# Patient Record
Sex: Female | Born: 1960
Health system: Southern US, Community
[De-identification: ages and names within clinical notes are randomized; demographics above are authoritative.]

## PROBLEM LIST (undated history)

## (undated) DIAGNOSIS — F32A Depression, unspecified: Secondary | ICD-10-CM

## (undated) DIAGNOSIS — F319 Bipolar disorder, unspecified: Secondary | ICD-10-CM

## (undated) DIAGNOSIS — F419 Anxiety disorder, unspecified: Secondary | ICD-10-CM

## (undated) DIAGNOSIS — F329 Major depressive disorder, single episode, unspecified: Secondary | ICD-10-CM

## (undated) DIAGNOSIS — M2550 Pain in unspecified joint: Secondary | ICD-10-CM

## (undated) DIAGNOSIS — I10 Essential (primary) hypertension: Secondary | ICD-10-CM

## (undated) DIAGNOSIS — F1010 Alcohol abuse, uncomplicated: Secondary | ICD-10-CM

---

## 2005-01-08 ENCOUNTER — Ambulatory Visit: Payer: Self-pay

## 2011-02-12 ENCOUNTER — Other Ambulatory Visit (HOSPITAL_COMMUNITY): Payer: Self-pay | Admitting: Family Medicine

## 2011-02-12 DIAGNOSIS — Z1231 Encounter for screening mammogram for malignant neoplasm of breast: Secondary | ICD-10-CM

## 2011-02-20 ENCOUNTER — Inpatient Hospital Stay (HOSPITAL_COMMUNITY)
Admission: AD | Admit: 2011-02-20 | Discharge: 2011-02-20 | Disposition: A | Discharge status: Home / Self Care | Payer: Self-pay | Source: Ambulatory Visit | Attending: Obstetrics and Gynecology | Admitting: Obstetrics and Gynecology

## 2011-02-20 ENCOUNTER — Ambulatory Visit (HOSPITAL_COMMUNITY)
Admission: RE | Admit: 2011-02-20 | Discharge: 2011-02-20 | Disposition: A | Discharge status: Home / Self Care | Payer: Self-pay | Source: Ambulatory Visit | Attending: Family Medicine | Admitting: Family Medicine

## 2011-02-20 DIAGNOSIS — Z1231 Encounter for screening mammogram for malignant neoplasm of breast: Secondary | ICD-10-CM | POA: Insufficient documentation

## 2011-02-20 DIAGNOSIS — R300 Dysuria: Secondary | ICD-10-CM | POA: Insufficient documentation

## 2011-02-20 DIAGNOSIS — N390 Urinary tract infection, site not specified: Secondary | ICD-10-CM

## 2011-02-20 LAB — URINE MICROSCOPIC-ADD ON

## 2011-02-20 LAB — URINALYSIS, ROUTINE W REFLEX MICROSCOPIC
Bilirubin Urine: NEGATIVE
Glucose, UA: NEGATIVE mg/dL
Ketones, ur: NEGATIVE mg/dL
pH: 6 (ref 5.0–8.0)

## 2011-02-25 ENCOUNTER — Inpatient Hospital Stay (HOSPITAL_COMMUNITY)
Admission: AD | Admit: 2011-02-25 | Discharge: 2011-02-25 | Disposition: A | Discharge status: Home / Self Care | Payer: Self-pay | Source: Ambulatory Visit | Attending: Family Medicine | Admitting: Family Medicine

## 2011-02-25 DIAGNOSIS — T3795XA Adverse effect of unspecified systemic anti-infective and antiparasitic, initial encounter: Secondary | ICD-10-CM | POA: Insufficient documentation

## 2011-02-25 DIAGNOSIS — L299 Pruritus, unspecified: Secondary | ICD-10-CM | POA: Insufficient documentation

## 2014-12-18 ENCOUNTER — Encounter (HOSPITAL_COMMUNITY): Payer: Self-pay | Admitting: Emergency Medicine

## 2014-12-18 ENCOUNTER — Emergency Department (HOSPITAL_COMMUNITY): Payer: Self-pay

## 2014-12-18 ENCOUNTER — Emergency Department (HOSPITAL_COMMUNITY)
Admission: EM | Admit: 2014-12-18 | Discharge: 2014-12-18 | Disposition: A | Discharge status: Home / Self Care | Payer: Self-pay | Attending: Emergency Medicine | Admitting: Emergency Medicine

## 2014-12-18 DIAGNOSIS — I10 Essential (primary) hypertension: Secondary | ICD-10-CM | POA: Insufficient documentation

## 2014-12-18 DIAGNOSIS — W010XXA Fall on same level from slipping, tripping and stumbling without subsequent striking against object, initial encounter: Secondary | ICD-10-CM | POA: Insufficient documentation

## 2014-12-18 DIAGNOSIS — M25519 Pain in unspecified shoulder: Secondary | ICD-10-CM

## 2014-12-18 DIAGNOSIS — F10129 Alcohol abuse with intoxication, unspecified: Secondary | ICD-10-CM | POA: Insufficient documentation

## 2014-12-18 DIAGNOSIS — Y9289 Other specified places as the place of occurrence of the external cause: Secondary | ICD-10-CM | POA: Insufficient documentation

## 2014-12-18 DIAGNOSIS — Y9389 Activity, other specified: Secondary | ICD-10-CM | POA: Insufficient documentation

## 2014-12-18 DIAGNOSIS — S43004A Unspecified dislocation of right shoulder joint, initial encounter: Secondary | ICD-10-CM

## 2014-12-18 DIAGNOSIS — S43141A Inferior dislocation of right acromioclavicular joint, initial encounter: Secondary | ICD-10-CM | POA: Insufficient documentation

## 2014-12-18 DIAGNOSIS — Y998 Other external cause status: Secondary | ICD-10-CM | POA: Insufficient documentation

## 2014-12-18 DIAGNOSIS — Z880 Allergy status to penicillin: Secondary | ICD-10-CM | POA: Insufficient documentation

## 2014-12-18 HISTORY — DX: Essential (primary) hypertension: I10

## 2014-12-18 MED ORDER — LIDOCAINE HCL 1 % IJ SOLN
30.0000 mL | Freq: Once | INTRAMUSCULAR | Status: AC
  Administered 2014-12-18: 30 mL

## 2014-12-18 MED ORDER — HYDROCODONE-ACETAMINOPHEN 5-325 MG PO TABS
2.0000 | ORAL_TABLET | Freq: Once | ORAL | Status: AC
  Administered 2014-12-18: 2 via ORAL
  Filled 2014-12-18: qty 2
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
## 2014-12-18 NOTE — Discharge Instructions (Signed)
Shoulder Dislocation Ms. Cleckler, follow-up with orthopedic surgery within 3 days for continued management. Wear your sling until then. If any symptoms worsen come back to the emergency department immediately. Thank you.  Shoulder dislocation is when your upper arm bone (humerus) is forced out of your shoulder joint. Your doctor will put your shoulder back into the joint by pulling on your arm or through surgery. Your arm will be placed in a shoulder immobilizer or sling. The shoulder immobilizer or sling holds your shoulder in place while it heals. HOME CARE   Rest your injured joint. Do not move it until instructed to do so.  Put ice on your injured joint as told by your doctor.  Put ice in a plastic bag.  Place a towel between your skin and the bag.  Leave the ice on for 15-20 minutes at a time, every 2 hours while you are awake.  Only take medicines as told by your doctor.  Squeeze a ball to exercise your hand. GET HELP RIGHT AWAY IF:   Your splint or sling becomes damaged.  Your pain becomes worse, not better.  You lose feeling in your arm or hand.  Your arm or hand becomes white or cold. MAKE SURE YOU:   Understand these instructions.  Will watch your condition.  Will get help right away if you are not doing well or get worse. Document Released: 12/23/2011 Document Reviewed: 12/23/2011 [facility] Patient Information 2015 [place], [place]. This information is not intended to replace advice given to you by your health care provider. Make sure you discuss any questions you have with your health care provider.
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
## 2015-04-01 ENCOUNTER — Emergency Department (HOSPITAL_COMMUNITY): Payer: Self-pay

## 2015-04-01 ENCOUNTER — Emergency Department (HOSPITAL_COMMUNITY)
Admission: EM | Admit: 2015-04-01 | Discharge: 2015-04-02 | Disposition: A | Discharge status: Home / Self Care | Payer: Self-pay | Attending: Emergency Medicine | Admitting: Emergency Medicine

## 2015-04-01 ENCOUNTER — Encounter (HOSPITAL_COMMUNITY): Payer: Self-pay | Admitting: Nurse Practitioner

## 2015-04-01 DIAGNOSIS — Z720 Tobacco use: Secondary | ICD-10-CM | POA: Insufficient documentation

## 2015-04-01 DIAGNOSIS — Y998 Other external cause status: Secondary | ICD-10-CM | POA: Insufficient documentation

## 2015-04-01 DIAGNOSIS — S0083XA Contusion of other part of head, initial encounter: Secondary | ICD-10-CM | POA: Insufficient documentation

## 2015-04-01 DIAGNOSIS — S62624A Displaced fracture of medial phalanx of right ring finger, initial encounter for closed fracture: Secondary | ICD-10-CM | POA: Insufficient documentation

## 2015-04-01 DIAGNOSIS — Y9289 Other specified places as the place of occurrence of the external cause: Secondary | ICD-10-CM | POA: Insufficient documentation

## 2015-04-01 DIAGNOSIS — S62604A Fracture of unspecified phalanx of right ring finger, initial encounter for closed fracture: Secondary | ICD-10-CM

## 2015-04-01 DIAGNOSIS — Z880 Allergy status to penicillin: Secondary | ICD-10-CM | POA: Insufficient documentation

## 2015-04-01 DIAGNOSIS — I10 Essential (primary) hypertension: Secondary | ICD-10-CM | POA: Insufficient documentation

## 2015-04-01 DIAGNOSIS — Y9389 Activity, other specified: Secondary | ICD-10-CM | POA: Insufficient documentation

## 2015-04-01 MED ORDER — TETRACAINE HCL 0.5 % OP SOLN
2.0000 [drp] | Freq: Once | OPHTHALMIC | Status: DC
  Filled 2015-04-01: qty 2

## 2015-04-01 MED ORDER — FLUORESCEIN SODIUM 1 MG OP STRP
1.0000 | ORAL_STRIP | Freq: Once | OPHTHALMIC | Status: AC
  Administered 2015-04-02: 1 via OPHTHALMIC
  Filled 2015-04-01: qty 1

## 2015-04-01 MED ORDER — IBUPROFEN 800 MG PO TABS: 800.0000 mg | ORAL_TABLET | Freq: Once | ORAL | Status: DC
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
## 2015-04-02 MED ORDER — OXYCODONE-ACETAMINOPHEN 5-325 MG PO TABS
1.0000 | ORAL_TABLET | Freq: Once | ORAL | Status: AC
  Administered 2015-04-02: 1 via ORAL
  Filled 2015-04-02: qty 1

## 2015-04-02 MED ORDER — NAPROXEN 500 MG PO TABS: 500.0000 mg | ORAL_TABLET | Freq: Two times a day (BID) | ORAL | Status: DC

## 2015-04-02 MED ORDER — FLUORESCEIN SODIUM 1 MG OP STRP
1.0000 | ORAL_STRIP | Freq: Once | OPHTHALMIC | Status: DC
  Filled 2015-04-02: qty 1
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
## 2015-04-02 NOTE — Progress Notes (Signed)
Pt calling family members to find ride home as she has been discharged.

## 2015-04-02 NOTE — Discharge Instructions (Signed)
Finger Fracture Fractures of fingers are breaks in the bones of the fingers. There are many types of fractures. There are different ways of treating these fractures. Your health care provider will discuss the best way to treat your fracture. CAUSES Traumatic injury is the main cause of broken fingers. These include:  Injuries while playing sports.  Workplace injuries.  Falls. RISK FACTORS Activities that can increase your risk of finger fractures include:  Sports.  Workplace activities that involve machinery.  A condition called osteoporosis, which can make your bones less dense and cause them to fracture more easily. SIGNS AND SYMPTOMS The main symptoms of a broken finger are pain and swelling within 15 minutes after the injury. Other symptoms include:  Bruising of your finger.  Stiffness of your finger.  Numbness of your finger.  Exposed bones (compound fracture) if the fracture is severe. DIAGNOSIS  The best way to diagnose a broken bone is with X-ray imaging. Additionally, your health care provider will use this X-ray image to evaluate the position of the broken finger bones.  TREATMENT  Finger fractures can be treated with:   Nonreduction--This means the bones are in place. The finger is splinted without changing the positions of the bone pieces. The splint is usually left on for about a week to 10 days. This will depend on your fracture and what your health care provider thinks.  Closed reduction--The bones are put back into position without using surgery. The finger is then splinted.  Open reduction and internal fixation--The fracture site is opened. Then the bone pieces are fixed into place with pins or some type of hardware. This is seldom required. It depends on the severity of the fracture. HOME CARE INSTRUCTIONS   Follow your health care provider's instructions regarding activities, exercises, and physical therapy.  Only take over-the-counter or prescription  medicines for pain, discomfort, or fever as directed by your health care provider. SEEK MEDICAL CARE IF: You have pain or swelling that limits the motion or use of your fingers. SEEK IMMEDIATE MEDICAL CARE IF:  Your finger becomes numb. MAKE SURE YOU:   Understand these instructions.  Will watch your condition.  Will get help right away if you are not doing well or get worse. Document Released: 01/12/2001 Document Revised: 07/21/2013 Document Reviewed: 05/12/2013 [facility] Patient Information 2015 [place], [place]. This information is not intended to replace advice given to you by your health care provider. Make sure you discuss any questions you have with your health care provider.  Eye Contusion An eye contusion is a deep bruise of the eye. This is often called a "black eye." Contusions are the result of an injury that caused bleeding under the skin. The contusion may turn blue, purple, or yellow. Minor injuries will give you a painless contusion, but more severe contusions may stay painful and swollen for a few weeks. If the eye contusion only involves the eyelids and tissues around the eye, the injured area will get better within a few days to weeks. However, eye contusions can be serious and affect the eyeball and sight. CAUSES   Blunt injury or trauma to the face or eye area.  A forehead injury that causes the blood under the skin to work its way down to the eyelids.  Rubbing the eyes due to irritation. SYMPTOMS   Swelling and redness around the eye.  Bruising around the eye.  Tenderness, soreness, or pain around the eye.  Blurry vision.  Tearing.  Eyeball redness. DIAGNOSIS  A  diagnosis is usually based on a thorough exam of the eye and surrounding area. The eye must be looked at carefully to make sure it is not injured and to make sure nothing else will threaten your vision. A vision test may be done. An X-ray or computed tomography (CT) scan may be needed to determine if  there are any associated injuries, such as broken bones (fractures). TREATMENT  If there is an injury to the eye, treatment will be determined by the nature of the injury. HOME CARE INSTRUCTIONS   Put ice on the injured area.  Put ice in a plastic bag.  Place a towel between your skin and the bag.  Leave the ice on for 15-20 minutes, 03-04 times a day.  If it is determined that there is no injury to the eye, you may continue normal activities.  Sunglasses may be worn to protect your eyes from bright light if light is uncomfortable.  Sleep with your head elevated. You can put an extra pillow under your head. This may help with discomfort.  Only take over-the-counter or prescription medicines for pain, discomfort, or fever as directed by your caregiver. Do not take aspirin for the first few days. This may increase bruising. SEEK IMMEDIATE MEDICAL CARE IF:   You have any form of vision loss.  You have double vision.  You feel nauseous.  You feel dizzy, sleepy, or like you will faint.  You have any fluid discharge from the eye or your nose.  You have swelling and discoloration that does not fade. MAKE SURE YOU:   Understand these instructions.  Will watch your condition.  Will get help right away if you are not doing well or get worse. Document Released: 09/27/2000 Document Revised: 12/23/2011 Document Reviewed: 08/16/2011 [facility] Patient Information 2015 [place], [place]. This information is not intended to replace advice given to you by your health care provider. Make sure you discuss any questions you have with your health care provider.  Assault, General Assault includes any behavior, whether intentional or reckless, which results in bodily injury to another person and/or damage to property. Included in this would be any behavior, intentional or reckless, that by its nature would be understood (interpreted) by a reasonable person as intent to harm another person or to  damage his/her property. Threats may be oral or written. They may be communicated through regular mail, computer, fax, or phone. These threats may be direct or implied. FORMS OF ASSAULT INCLUDE:  Physically assaulting a person. This includes physical threats to inflict physical harm as well as:  Slapping.  Hitting.  Poking.  Kicking.  Punching.  Pushing.  Arson.  Sabotage.  Equipment vandalism.  Damaging or destroying property.  Throwing or hitting objects.  Displaying a weapon or an object that appears to be a weapon in a threatening manner.  Carrying a firearm of any kind.  Using a weapon to harm someone.  Using greater physical size/strength to intimidate another.  Making intimidating or threatening gestures.  Bullying.  Hazing.  Intimidating, threatening, hostile, or abusive language directed toward another person.  It communicates the intention to engage in violence against that person. And it leads a reasonable person to expect that violent behavior may occur.  Stalking another person. IF IT HAPPENS AGAIN:  Immediately call for emergency help (911 in [place]  If someone poses clear and immediate danger to you, seek legal authorities to have a protective or restraining order put in place.  Less threatening assaults can at  least be reported to authorities. STEPS TO TAKE IF A SEXUAL ASSAULT HAS HAPPENED  Go to an area of safety. This may include a shelter or staying with a friend. Stay away from the area where you have been attacked. A large percentage of sexual assaults are caused by a friend, relative or associate.  If medications were given by your caregiver, take them as directed for the full length of time prescribed.  Only take over-the-counter or prescription medicines for pain, discomfort, or fever as directed by your caregiver.  If you have come in contact with a sexual disease, find out if you are to be tested again. If your caregiver is  concerned about the HIV/AIDS virus, he/she may require you to have continued testing for several months.  For the protection of your privacy, test results can not be given over the phone. Make sure you receive the results of your test. If your test results are not back during your visit, make an appointment with your caregiver to find out the results. Do not assume everything is normal if you have not heard from your caregiver or the medical facility. It is important for you to follow up on all of your test results.  File appropriate papers with authorities. This is important in all assaults, even if it has occurred in a family or by a friend. SEEK MEDICAL CARE IF:  You have new problems because of your injuries.  You have problems that may be because of the medicine you are taking, such as:  Rash.  Itching.  Swelling.  Trouble breathing.  You develop belly (abdominal) pain, feel sick to your stomach (nausea) or are vomiting.  You begin to run a temperature.  You need supportive care or referral to a rape crisis center. These are centers with trained personnel who can help you get through this ordeal. SEEK IMMEDIATE MEDICAL CARE IF:  You are afraid of being threatened, beaten, or abused. In [place] call 911.  You receive new injuries related to abuse.  You develop severe pain in any area injured in the assault or have any change in your condition that concerns you.  You faint or lose consciousness.  You develop chest pain or shortness of breath. Document Released: 09/30/2005 Document Revised: 12/23/2011 Document Reviewed: 05/18/2008 [facility] Patient Information 2015 [place], [place]. This information is not intended to replace advice given to you by your health care provider. Make sure you discuss any questions you have with your health care provider.

## 2015-07-19 ENCOUNTER — Other Ambulatory Visit: Payer: Self-pay

## 2015-07-20 ENCOUNTER — Emergency Department (HOSPITAL_COMMUNITY)
Admission: EM | Admit: 2015-07-20 | Discharge: 2015-07-20 | Disposition: A | Discharge status: Home / Self Care | Payer: Self-pay | Attending: Emergency Medicine | Admitting: Emergency Medicine

## 2015-07-20 ENCOUNTER — Encounter (HOSPITAL_COMMUNITY): Payer: Self-pay | Admitting: Emergency Medicine

## 2015-07-20 DIAGNOSIS — M542 Cervicalgia: Secondary | ICD-10-CM | POA: Insufficient documentation

## 2015-07-20 DIAGNOSIS — Z880 Allergy status to penicillin: Secondary | ICD-10-CM | POA: Insufficient documentation

## 2015-07-20 DIAGNOSIS — I10 Essential (primary) hypertension: Secondary | ICD-10-CM | POA: Insufficient documentation

## 2015-07-20 DIAGNOSIS — Z720 Tobacco use: Secondary | ICD-10-CM | POA: Insufficient documentation

## 2015-07-20 DIAGNOSIS — R200 Anesthesia of skin: Secondary | ICD-10-CM | POA: Insufficient documentation

## 2015-07-20 LAB — CBC WITH DIFFERENTIAL/PLATELET
BASOS PCT: 1 %
Basophils Absolute: 0 10*3/uL (ref 0.0–0.1)
EOS ABS: 0.1 10*3/uL (ref 0.0–0.7)
Eosinophils Relative: 2 %
HCT: 36.2 % (ref 36.0–46.0)
HEMOGLOBIN: 11.6 g/dL — AB (ref 12.0–15.0)
LYMPHS ABS: 1.6 10*3/uL (ref 0.7–4.0)
Lymphocytes Relative: 30 %
MCH: 27.2 pg (ref 26.0–34.0)
MCHC: 32 g/dL (ref 30.0–36.0)
MCV: 84.8 fL (ref 78.0–100.0)
MONO ABS: 0.4 10*3/uL (ref 0.1–1.0)
MONOS PCT: 7 %
Neutro Abs: 3.3 10*3/uL (ref 1.7–7.7)
Neutrophils Relative %: 60 %
Platelets: 158 10*3/uL (ref 150–400)
RBC: 4.27 MIL/uL (ref 3.87–5.11)
RDW: 14.8 % (ref 11.5–15.5)
WBC: 5.3 10*3/uL (ref 4.0–10.5)

## 2015-07-20 LAB — BASIC METABOLIC PANEL
Anion gap: 8 (ref 5–15)
BUN: 13 mg/dL (ref 6–20)
CALCIUM: 9.4 mg/dL (ref 8.9–10.3)
CHLORIDE: 108 mmol/L (ref 101–111)
CO2: 27 mmol/L (ref 22–32)
CREATININE: 1.04 mg/dL — AB (ref 0.44–1.00)
GFR calc non Af Amer: 59 mL/min — ABNORMAL LOW (ref >60)
Glucose, Bld: 71 mg/dL (ref 65–99)
Potassium: 3.9 mmol/L (ref 3.5–5.1)
Sodium: 143 mmol/L (ref 135–145)

## 2015-07-20 MED ORDER — NAPROXEN 500 MG PO TABS: 500.0000 mg | ORAL_TABLET | Freq: Two times a day (BID) | ORAL | Status: DC

## 2015-07-20 MED ORDER — HYDROCHLOROTHIAZIDE 25 MG PO TABS: 25.0000 mg | ORAL_TABLET | Freq: Every day | ORAL | Status: DC

## 2015-07-20 NOTE — Progress Notes (Signed)
[facility] for [facility] [phone]  Spoke to patient regarding primary care resources and the [facility] orange card. Patient states she has an upcoming appointment at [facility] @ [place] and also with the [employer] center. Patient informed she could obtain medication assistance from the [facility] once discharged. Patient provided with the orange card application and the upcoming enrollment on 07/28/15 @ 8:00am at the clinic. Patient is well linked with community resources. My contact information provided for any future questions or concerns. No other [facility] Specialist needs identified at this time.

## 2015-07-20 NOTE — Discharge Instructions (Signed)
Start hydrochlorothiazide for your high blood pressure. Make an appointment to follow-up with the wellness clinic. Take the Naprosyn as needed neck pain. Return for any new or worse symptoms.

## 2015-07-20 NOTE — ED Provider Notes (Signed)
CSN: [id number]     Arrival date & time 07/20/15  1218 History   First MD Initiated Contact with Patient 07/20/15 1224     Chief Complaint  Patient presents with  . Hypertension     (Consider location/radiation/quality/duration/timing/severity/associated sxs/prior Treatment) Patient is a 54 y.o. female presenting with hypertension. The history is provided by the patient.  Hypertension Pertinent negatives include no chest pain, no abdominal pain, no headaches and no shortness of breath.   patient with past history of hypertension has been off hypertensive meds for 2 years. Patient had blood pressure checked at work today it was elevated and that was recommended that she get seen. Patient is talked about some intermittent left-sided numbness that occurs only lasting seconds. Also pain with the left lateral part of the neck. No chest pain no shortness of breath.  Past Medical History  Diagnosis Date  . Hypertension    History reviewed. No pertinent past surgical history. History reviewed. No pertinent family history. Social History  Substance Use Topics  . Smoking status: Current Every Day Smoker    Types: Cigarettes  . Smokeless tobacco: None  . Alcohol Use: Yes   OB History    No data available     Review of Systems  Constitutional: Negative for fever.  HENT: Negative for congestion.   Eyes: Negative for visual disturbance.  Respiratory: Negative for shortness of breath.   Cardiovascular: Negative for chest pain.  Gastrointestinal: Negative for nausea, vomiting and abdominal pain.  Genitourinary: Negative for dysuria.  Musculoskeletal: Positive for neck pain. Negative for back pain.  Skin: Negative for rash.  Neurological: Positive for numbness. Negative for syncope, weakness and headaches.  Hematological: Does not bruise/bleed easily.  Psychiatric/Behavioral: Negative for confusion.      Allergies  Penicillins  Home Medications   Prior to Admission medications    Medication Sig Start Date End Date Taking? Authorizing Provider  acetaminophen-codeine (TYLENOL #2) 300-15 MG tablet Take 1 tablet by mouth every 4 (four) hours as needed for moderate pain.   Yes Historical Provider, MD  naproxen (NAPROSYN) 500 MG tablet Take 1 tablet (500 mg total) by mouth 2 (two) times daily. Patient taking differently: Take 500 mg by mouth 2 (two) times daily as needed for mild pain.  04/02/15  Yes Antony Madura, PA-C  hydrochlorothiazide (HYDRODIURIL) 25 MG tablet Take 1 tablet (25 mg total) by mouth daily. 07/20/15   Vanetta Mulders, MD  naproxen (NAPROSYN) 500 MG tablet Take 1 tablet (500 mg total) by mouth 2 (two) times daily. 07/20/15   Vanetta Mulders, MD   BP 155/96 mmHg  Pulse 55  Resp 20  SpO2 100% Physical Exam  Constitutional: She is oriented to person, place, and time. She appears well-developed and well-nourished. No distress.  HENT:  Head: Normocephalic and atraumatic.  Mouth/Throat: Oropharynx is clear and moist.  Eyes: Conjunctivae are normal. Pupils are equal, round, and reactive to light.  Neck: Normal range of motion. Neck supple.  Cardiovascular: Normal rate, regular rhythm and normal heart sounds.   No murmur heard. Pulmonary/Chest: Effort normal and breath sounds normal. No respiratory distress.  Abdominal: Soft. Bowel sounds are normal. There is no tenderness.  Musculoskeletal: Normal range of motion. She exhibits no edema.  Neurological: She is alert and oriented to person, place, and time. No cranial nerve deficit. She exhibits normal muscle tone. Coordination normal.  Skin: Skin is warm. No rash noted.  Nursing note and vitals reviewed.   ED Course  Procedures (including critical  care time) Labs Review Labs Reviewed  CBC WITH DIFFERENTIAL/PLATELET - Abnormal; Notable for the following:    Hemoglobin 11.6 (*)    All other components within normal limits  BASIC METABOLIC PANEL - Abnormal; Notable for the following:    Creatinine, Ser 1.04  (*)    GFR calc non Af Amer 59 (*)    All other components within normal limits   Results for orders placed or performed during the hospital encounter of 07/20/15  CBC with Differential/Platelet  Result Value Ref Range   WBC 5.3 4.0 - 10.5 K/uL   RBC 4.27 3.87 - 5.11 MIL/uL   Hemoglobin 11.6 (L) 12.0 - 15.0 g/dL   HCT 09.8 11.9 - 14.7 %   MCV 84.8 78.0 - 100.0 fL   MCH 27.2 26.0 - 34.0 pg   MCHC 32.0 30.0 - 36.0 g/dL   RDW 82.9 56.2 - 13.0 %   Platelets 158 150 - 400 K/uL   Neutrophils Relative % 60 %   Neutro Abs 3.3 1.7 - 7.7 K/uL   Lymphocytes Relative 30 %   Lymphs Abs 1.6 0.7 - 4.0 K/uL   Monocytes Relative 7 %   Monocytes Absolute 0.4 0.1 - 1.0 K/uL   Eosinophils Relative 2 %   Eosinophils Absolute 0.1 0.0 - 0.7 K/uL   Basophils Relative 1 %   Basophils Absolute 0.0 0.0 - 0.1 K/uL  Basic metabolic panel  Result Value Ref Range   Sodium 143 135 - 145 mmol/L   Potassium 3.9 3.5 - 5.1 mmol/L   Chloride 108 101 - 111 mmol/L   CO2 27 22 - 32 mmol/L   Glucose, Bld 71 65 - 99 mg/dL   BUN 13 6 - 20 mg/dL   Creatinine, Ser 8.65 (H) 0.44 - 1.00 mg/dL   Calcium 9.4 8.9 - 78.4 mg/dL   GFR calc non Af Amer 59 (L) >60 mL/min   GFR calc Af Amer >60 >60 mL/min   Anion gap 8 5 - 15     Imaging Review No results found. I have personally reviewed and evaluated these images and lab results as part of my medical decision-making.   EKG Interpretation   Date/Time:  Thursday July 20 2015 12:27:56 EDT Ventricular Rate:  72 PR Interval:  130 QRS Duration: 86 QT Interval:  398 QTC Calculation: 435 R Axis:   80 Text Interpretation:  Normal sinus rhythm with sinus arrhythmia Possible  Left atrial enlargement Borderline ECG no previous Confirmed by Marshel Golubski   MD, Hiroko Tregre ([id number]) on 07/20/2015 12:40:29 PM      MDM   Final diagnoses:  Essential hypertension  Neck pain    Patient with history of hypertension in the past as been off meds for 2 years. Patient used to be on  hydrochlorothiazide. Will restart that have her follow-up at the wellness clinic. Patient with the some left-sided neck pain without any acute injury. Will treat with Naprosyn. Patient blood pressure has improved here there mouth but is still consistently elevated. Patient without any acute symptoms nontoxic.    Vanetta Mulders, MD 07/20/15 [phone]

## 2015-07-20 NOTE — ED Notes (Signed)
Pt sts htn while at work today; pt sts some intermittent left sided numbness and pain in shoulders x weeks but denies numbness at present; pt sts not taking htn meds x 2 years

## 2015-10-17 DIAGNOSIS — Z139 Encounter for screening, unspecified: Secondary | ICD-10-CM

## 2015-10-22 NOTE — Congregational Nurse Program (Signed)
Congregational Nurse Program Note  Date of Encounter: 10/17/2015  Past Medical History: Past Medical History  Diagnosis Date  . Hypertension     Encounter Details:     CNP Questionnaire - 10/17/15 1525    Patient Demographics   Is this a new or existing patient? New   Patient is considered a/an Not Applicable   Race African-American/Black   Patient Assistance   Location of Patient Assistance Not Applicable   Patient's financial/insurance status Low Income   Uninsured Patient Yes   Interventions Counseled to make appt. with provider   Patient referred to apply for the following financial assistance Not Applicable   Food insecurities addressed Provided food supplies   Transportation assistance No   Assistance securing medications No   Educational health offerings Chronic disease;Medications   Encounter Details   Primary purpose of visit Education/Health Concerns   Was an Emergency Department visit averted? Not Applicable   Does patient have a medical provider? Yes   Patient referred to Clinic   Was a mental health screening completed? (GAINS tool) No   Does patient have dental issues? No   Since previous encounter, have you referred patient for abnormal blood glucose that resulted in a new diagnosis or medication change? No   For Abstraction Use Only   Does patient have insurance? No       Clinic visit.  B/P check.  B/P elevated.  States has not taken medication today.  Encouraged her to do and to return to clinic for recheck of B/P

## 2015-11-09 DIAGNOSIS — Z139 Encounter for screening, unspecified: Secondary | ICD-10-CM

## 2015-11-10 NOTE — Congregational Nurse Program (Signed)
Congregational Nurse Program Note  Date of Encounter: 11/09/2015  Past Medical History: Past Medical History  Diagnosis Date  . Hypertension     Encounter Details:     CNP Questionnaire - 11/09/15 1416    Patient Demographics   Is this a new or existing patient? Existing   Patient is considered a/an Not Applicable   Race African-American/Black   Patient Assistance   Location of Patient Assistance Not Applicable   Patient's financial/insurance status Low Income   Uninsured Patient Yes   Patient referred to apply for the following financial assistance Not Applicable   Food insecurities addressed Provided food supplies   Transportation assistance No   Assistance securing medications No   Educational health offerings Chronic disease;Medications   Encounter Details   Primary purpose of visit Education/Health Concerns   Was an Emergency Department visit averted? Not Applicable   Does patient have a medical provider? Yes   Patient referred to Clinic   Was a mental health screening completed? (GAINS tool) No   Does patient have dental issues? No   Since previous encounter, have you referred patient for abnormal blood pressure that resulted in a new diagnosis or medication change? No   Since previous encounter, have you referred patient for abnormal blood glucose that resulted in a new diagnosis or medication change? No   For Abstraction Use Only   Does patient have insurance? No       Clinic visit for B/P check.  B/P 140/100.  States has an appointment with HCP on 1/31.  Encouraged her to keep appointment.  Discussed with her the impact of smoking on her B/P.  Encouraged her to begin cutting down with the plan to stop smoking when she is settled into permanent housing

## 2015-11-21 DIAGNOSIS — Z139 Encounter for screening, unspecified: Secondary | ICD-10-CM

## 2015-11-21 NOTE — Congregational Nurse Program (Signed)
Congregational Nurse Program Note  Date of Encounter: 11/21/2015  Past Medical History: Past Medical History  Diagnosis Date  . Hypertension     Encounter Details:     CNP Questionnaire - 11/21/15 1556    Patient Demographics   Is this a new or existing patient? Existing   Patient is considered a/an Not Applicable   Race African-American/Black   Patient Assistance   Location of Patient Assistance Not Applicable   Patient's financial/insurance status Low Income   Uninsured Patient Yes   Interventions Counseled to make appt. with provider   Patient referred to apply for the following financial assistance Not Applicable   Food insecurities addressed Provided food supplies   Transportation assistance No   Assistance securing medications No   Educational health offerings Chronic disease;Medications   Encounter Details   Primary purpose of visit Education/Health Concerns;Chronic Illness/Condition Visit   Was an Emergency Department visit averted? Not Applicable   Does patient have a medical provider? Yes   Patient referred to Clinic   Was a mental health screening completed? (GAINS tool) No   Does patient have dental issues? No   Does patient have vision issues? No   Since previous encounter, have you referred patient for abnormal blood pressure that resulted in a new diagnosis or medication change? No   Since previous encounter, have you referred patient for abnormal blood glucose that resulted in a new diagnosis or medication change? No   For Abstraction Use Only   Does patient have insurance? No       B/P check/  Still elevated at 130/94.  Encouraged to follow through with PCP

## 2015-12-14 DIAGNOSIS — Z139 Encounter for screening, unspecified: Secondary | ICD-10-CM

## 2015-12-19 NOTE — Congregational Nurse Program (Signed)
Congregational Nurse Program Note  Date of Encounter: 12/14/2015  Past Medical History: Past Medical History  Diagnosis Date  . Hypertension     Encounter Details:     CNP Questionnaire - 12/14/15 1444    Patient Demographics   Is this a new or existing patient? Existing   Patient is considered a/an Not Applicable   Race African-American/Black   Patient Assistance   Location of Patient Assistance Not Applicable   Patient's financial/insurance status Low Income;[facility] Card/[facility]s   Uninsured Patient Yes   Interventions Counseled to make appt. with provider   Patient referred to apply for the following financial assistance Not Applicable   Food insecurities addressed Provided food supplies   Transportation assistance No   Assistance securing medications Yes   Type of Assistance [facility]   Educational health offerings Chronic disease;Medications;Hypertension   Encounter Details   Primary purpose of visit Education/Health Concerns;Chronic Illness/Condition Visit   Was an Emergency Department visit averted? Not Applicable   Does patient have a medical provider? Yes   Patient referred to Clinic   Was a mental health screening completed? (GAINS tool) No   Does patient have dental issues? No   Does patient have vision issues? No   Since previous encounter, have you referred patient for abnormal blood pressure that resulted in a new diagnosis or medication change? No   Since previous encounter, have you referred patient for abnormal blood glucose that resulted in a new diagnosis or medication change? No   For Abstraction Use Only   Does patient have insurance? No     Has a script for HCTZ 25 mg.  Needed assistance with getting script filled.  Script filled for client through [facility].  Client given instructions about applying for Medication Assistance Program

## 2016-01-21 ENCOUNTER — Emergency Department (HOSPITAL_COMMUNITY): Payer: Self-pay

## 2016-01-21 ENCOUNTER — Emergency Department (HOSPITAL_COMMUNITY)
Admission: EM | Admit: 2016-01-21 | Discharge: 2016-01-22 | Disposition: A | Discharge status: Home / Self Care | Payer: Self-pay | Attending: Emergency Medicine | Admitting: Emergency Medicine

## 2016-01-21 ENCOUNTER — Encounter (HOSPITAL_COMMUNITY): Payer: Self-pay | Admitting: Oncology

## 2016-01-21 DIAGNOSIS — Y998 Other external cause status: Secondary | ICD-10-CM | POA: Insufficient documentation

## 2016-01-21 DIAGNOSIS — IMO0001 Reserved for inherently not codable concepts without codable children: Secondary | ICD-10-CM

## 2016-01-21 DIAGNOSIS — T1490XA Injury, unspecified, initial encounter: Secondary | ICD-10-CM

## 2016-01-21 DIAGNOSIS — I10 Essential (primary) hypertension: Secondary | ICD-10-CM | POA: Insufficient documentation

## 2016-01-21 DIAGNOSIS — W010XXA Fall on same level from slipping, tripping and stumbling without subsequent striking against object, initial encounter: Secondary | ICD-10-CM | POA: Insufficient documentation

## 2016-01-21 DIAGNOSIS — F17210 Nicotine dependence, cigarettes, uncomplicated: Secondary | ICD-10-CM | POA: Insufficient documentation

## 2016-01-21 DIAGNOSIS — S53105A Unspecified dislocation of left ulnohumeral joint, initial encounter: Secondary | ICD-10-CM | POA: Insufficient documentation

## 2016-01-21 DIAGNOSIS — Z791 Long term (current) use of non-steroidal anti-inflammatories (NSAID): Secondary | ICD-10-CM | POA: Insufficient documentation

## 2016-01-21 DIAGNOSIS — Y92009 Unspecified place in unspecified non-institutional (private) residence as the place of occurrence of the external cause: Secondary | ICD-10-CM | POA: Insufficient documentation

## 2016-01-21 DIAGNOSIS — Z880 Allergy status to penicillin: Secondary | ICD-10-CM | POA: Insufficient documentation

## 2016-01-21 DIAGNOSIS — Y9389 Activity, other specified: Secondary | ICD-10-CM | POA: Insufficient documentation

## 2016-01-21 DIAGNOSIS — T148XXA Other injury of unspecified body region, initial encounter: Secondary | ICD-10-CM

## 2016-01-21 HISTORY — DX: Alcohol abuse, uncomplicated: F10.10

## 2016-01-21 LAB — CBC WITH DIFFERENTIAL/PLATELET
BASOS ABS: 0 10*3/uL (ref 0.0–0.1)
BASOS PCT: 0 %
EOS ABS: 0.1 10*3/uL (ref 0.0–0.7)
EOS PCT: 1 %
HCT: 32.6 % — ABNORMAL LOW (ref 36.0–46.0)
Hemoglobin: 10.7 g/dL — ABNORMAL LOW (ref 12.0–15.0)
Lymphocytes Relative: 18 %
Lymphs Abs: 1.8 10*3/uL (ref 0.7–4.0)
MCH: 27 pg (ref 26.0–34.0)
MCHC: 32.8 g/dL (ref 30.0–36.0)
MCV: 82.3 fL (ref 78.0–100.0)
MONO ABS: 0.4 10*3/uL (ref 0.1–1.0)
Monocytes Relative: 4 %
NEUTROS ABS: 7.5 10*3/uL (ref 1.7–7.7)
Neutrophils Relative %: 77 %
PLATELETS: 159 10*3/uL (ref 150–400)
RBC: 3.96 MIL/uL (ref 3.87–5.11)
RDW: 14.7 % (ref 11.5–15.5)
WBC: 9.7 10*3/uL (ref 4.0–10.5)

## 2016-01-21 MED ORDER — HYDROMORPHONE HCL 1 MG/ML IJ SOLN
1.0000 mg | Freq: Once | INTRAMUSCULAR | Status: AC
  Administered 2016-01-21: 1 mg via INTRAVENOUS
  Filled 2016-01-21: qty 1

## 2016-01-21 MED ORDER — PROPOFOL 10 MG/ML IV BOLUS
0.5000 mg/kg | Freq: Once | INTRAVENOUS | Status: AC
  Administered 2016-01-22: 36.3 mg via INTRAVENOUS
  Filled 2016-01-21: qty 20

## 2016-01-21 NOTE — ED Provider Notes (Signed)
CSN: [id number]     Arrival date & time 01/21/16  2238 History   First MD Initiated Contact with Patient 01/21/16 2251     Chief Complaint  Patient presents with  . Elbow Injury    HPI   Marelin Tat is a 55 y.o. female with a PMH of HTN and alcohol abuse who presents to the ED with left elbow pain s/p fall. She reports she was cleaned her house when she tripped and fell, landing on her left elbow. She notes constant pain since that time. She states movement exacerbates her pain. She tried taking 2 tequila shots and drinking beer at home in an attempt to fix her arm, which was unsuccessful. She notes paresthesia, though denies numbness or weakness. She denies hitting her head, LOC, additional injury, neck pain, back pain, chest pain, shortness of breath, abdominal pain, nausea, vomiting.   Past Medical History  Diagnosis Date  . Hypertension   . ETOH abuse    History reviewed. No pertinent past surgical history. No family history on file. Social History  Substance Use Topics  . Smoking status: Current Every Day Smoker    Types: Cigarettes  . Smokeless tobacco: Never Used  . Alcohol Use: Yes   OB History    No data available      Review of Systems  Respiratory: Negative for shortness of breath.   Cardiovascular: Negative for chest pain.  Gastrointestinal: Negative for nausea, vomiting and abdominal pain.  Musculoskeletal: Positive for arthralgias. Negative for back pain and neck pain.  Neurological: Negative for dizziness, syncope, weakness, light-headedness, numbness and headaches.  All other systems reviewed and are negative.     Allergies  Penicillins  Home Medications   Prior to Admission medications   Medication Sig Start Date End Date Taking? Authorizing Provider  acetaminophen-codeine (TYLENOL #2) 300-15 MG tablet Take 1 tablet by mouth every 4 (four) hours as needed for moderate pain.    Historical Provider, MD  hydrochlorothiazide (HYDRODIURIL) 25 MG tablet  Take 1 tablet (25 mg total) by mouth daily. 07/20/15   Vanetta Mulders, MD  naproxen (NAPROSYN) 500 MG tablet Take 1 tablet (500 mg total) by mouth 2 (two) times daily. Patient taking differently: Take 500 mg by mouth 2 (two) times daily as needed for mild pain.  04/02/15   Antony Madura, PA-C  naproxen (NAPROSYN) 500 MG tablet Take 1 tablet (500 mg total) by mouth 2 (two) times daily. 07/20/15   Vanetta Mulders, MD    BP 162/139 mmHg  Pulse 82  Temp(Src) 97.6 F (36.4 C) (Oral)  Resp 14  Ht  (1.676 m)  Wt 72.576 kg  BMI 25.84 kg/m2  SpO2 100% Physical Exam  Constitutional: She is oriented to person, place, and time. She appears well-developed and well-nourished. No distress.  HENT:  Head: Normocephalic and atraumatic. Head is without raccoon's eyes, without Battle's sign, without abrasion, without contusion and without laceration.  Right Ear: External ear normal.  Left Ear: External ear normal.  Nose: Nose normal.  Mouth/Throat: Uvula is midline, oropharynx is clear and moist and mucous membranes are normal.  Eyes: Conjunctivae, EOM and lids are normal. Pupils are equal, round, and reactive to light. Right eye exhibits no discharge. Left eye exhibits no discharge. No scleral icterus.  Neck: Normal range of motion. Neck supple. No spinous process tenderness and no muscular tenderness present.  Cardiovascular: Normal rate, regular rhythm, normal heart sounds, intact distal pulses and normal pulses.   Pulmonary/Chest: Effort normal and  breath sounds normal. No respiratory distress. She has no wheezes. She has no rales.  Abdominal: Soft. Normal appearance and bowel sounds are normal. She exhibits no distension and no mass. There is no tenderness. There is no rigidity, no rebound and no guarding.  Musculoskeletal: She exhibits tenderness. She exhibits no edema.  Obvious deformity to left elbow with associated TTP and decreased ROM due to pain. TTP to left shoulder. No TTP to right upper  extremity, pelvis, or lower extremities bilaterally.  Neurological: She is alert and oriented to person, place, and time. She has normal strength. No cranial nerve deficit or sensory deficit.  Skin: Skin is warm, dry and intact. No rash noted. She is not diaphoretic. No erythema. No pallor.  Psychiatric: She has a normal mood and affect. Her speech is normal and behavior is normal.  Nursing note and vitals reviewed.   ED Course  Procedures (including critical care time)  Labs Review Labs Reviewed  CBC WITH DIFFERENTIAL/PLATELET - Abnormal; Notable for the following:    Hemoglobin 10.7 (*)    HCT 32.6 (*)    All other components within normal limits  BASIC METABOLIC PANEL - Abnormal; Notable for the following:    Creatinine, Ser 1.14 (*)    Calcium 8.7 (*)    GFR calc non Af Amer 53 (*)    All other components within normal limits  ETHANOL - Abnormal; Notable for the following:    Alcohol, Ethyl (B) 225 (*)    All other components within normal limits    Imaging Review Dg Elbow 2 Views Left  01/22/2016  CLINICAL DATA:  Larey Seat, now with pain and deformity EXAM: LEFT ELBOW - 2 VIEW COMPARISON:  None. FINDINGS: There is a posterior-lateral dislocation of the elbow. There are bone fragments displaced around the joint, many of which appear to arise from the lateral epicondyle. Probable radial head fracture as well. IMPRESSION: Left elbow dislocation Electronically Signed   By: Ellery Plunk M.D.   On: 01/22/2016 00:09   Dg Shoulder Left  01/22/2016  CLINICAL DATA:  Pain and deformity after falling. EXAM: LEFT SHOULDER - 2+ VIEW COMPARISON:  None. FINDINGS: The left elbow dislocation is again evident. Remainder of the humerus is intact. No soft tissue foreign body. IMPRESSION: Left elbow dislocation Electronically Signed   By: Ellery Plunk M.D.   On: 01/22/2016 00:10   Dg Humerus Left  01/22/2016  CLINICAL DATA:  Pain and deformity after falling EXAM: LEFT HUMERUS - 2+ VIEW  COMPARISON:  None. FINDINGS: Left elbow dislocation is again evident. Remainder of the humerus is intact. No soft tissue foreign body. IMPRESSION: Left elbow dislocation Electronically Signed   By: Ellery Plunk M.D.   On: 01/22/2016 00:11   I have personally reviewed and evaluated these images and lab results as part of my medical decision-making.   EKG Interpretation None      MDM   Final diagnoses:  Elbow dislocation, left, initial encounter    55 year old female presents with left elbow pain after tripping and falling tonight prior to arrival. States she drank tequila and beer after falling in an attempt to "fix her arm." Notes paresthesia, though denies numbness or weakness. Denies hitting her head, LOC, additional injury, neck pain, back pain, chest pain, shortness of breath, abdominal pain, nausea, vomiting.  Patient is afebrile. Vital signs stable. Head normocephalic and atraumatic. GCS 15. Normal neuro exam with no focal deficit. On exam, patient has an obvious deformity to her left  elbow with associated TTP and decreased ROM due to pain. TTP to left shoulder. No TTP to right upper extremity, pelvis, or lower extremities bilaterally. Patient is NVI.  CBC negative for leukocytosis, hemoglobin 10.7. BMP remarkable for creatinine 1.14. Ethanol elevated at 225.  Imaging of left elbow remarkable for posterior lateral dislocation and probable radial head fracture.   Patient discussed with and seen by Dr. Adela LankFloyd. Attempted conscious sedation and reduction, though was unsuccessful. Hand consulted. Spoke with Dr. Izora Ribasoley, who advised to call ortho. Ortho consulted. Spoke with Dr. Roda ShuttersXu, who will see the patient in the ED for reduction. Dr. Wilkie AyeHorton to assist with procedural sedation.  Left elbow reduced by Dr. Roda ShuttersXu, and the patient tolerated this well with stable vital signs throughout.  CT left elbow pending. Patient to follow-up with ortho. Dr. Wilkie AyeHorton will assume care and discharge patient when  stable.   BP 162/139 mmHg  Pulse 82  Temp(Src) 97.6 F (36.4 C) (Oral)  Resp 14  Ht 5\' 6"  (1.676 m)  Wt 72.576 kg  BMI 25.84 kg/m2  SpO2 100%      Mady GemmaElizabeth C Henok Heacock, PA-C 01/22/16 0225  Melene Planan Floyd, DO 01/22/16 [id number]

## 2016-01-21 NOTE — ED Notes (Signed)
Bed: [id number] Expected date:  Expected time:  Means of arrival:  Comments: EMS 25F fall

## 2016-01-21 NOTE — ED Notes (Signed)
Pt did remove splint applied by EMS.  Pt reports drinking tequila and bud light in an attempt to fix her arm at home.

## 2016-01-21 NOTE — ED Notes (Signed)
Per EMS pt has significant amount of ETOH on board and fell and injured her left elbow outside.  Pt is verbally aggressive screaming, "take this fucking thing off my arm."  Pt is referring to the splint applied by EMS.  Encouraged pt not to take splint off however she was uncooperative w/ request.

## 2016-01-22 ENCOUNTER — Emergency Department (HOSPITAL_COMMUNITY): Payer: Self-pay

## 2016-01-22 LAB — BASIC METABOLIC PANEL
ANION GAP: 9 (ref 5–15)
BUN: 15 mg/dL (ref 6–20)
CALCIUM: 8.7 mg/dL — AB (ref 8.9–10.3)
CO2: 24 mmol/L (ref 22–32)
Chloride: 111 mmol/L (ref 101–111)
Creatinine, Ser: 1.14 mg/dL — ABNORMAL HIGH (ref 0.44–1.00)
GFR, EST NON AFRICAN AMERICAN: 53 mL/min — AB (ref >60)
GLUCOSE: 96 mg/dL (ref 65–99)
POTASSIUM: 3.9 mmol/L (ref 3.5–5.1)
Sodium: 144 mmol/L (ref 135–145)

## 2016-01-22 LAB — ETHANOL: Alcohol, Ethyl (B): 225 mg/dL — ABNORMAL HIGH (ref <5)

## 2016-01-22 MED ORDER — HYDROCODONE-ACETAMINOPHEN 5-325 MG PO TABS: 1.0000 | ORAL_TABLET | ORAL | Status: DC | PRN

## 2016-01-22 MED ORDER — KETAMINE HCL-SODIUM CHLORIDE 100-0.9 MG/10ML-% IV SOSY
1.0000 mg/kg | PREFILLED_SYRINGE | Freq: Once | INTRAVENOUS | Status: AC
  Administered 2016-01-22: 73 mg via INTRAVENOUS
  Filled 2016-01-22: qty 10

## 2016-01-22 NOTE — ED Notes (Signed)
0000 Time out preformed by Dr. Adela LankFloyd. 0001 36.3 mg propofol given by Dr. Adela LankFloyd 0002 Pt still awake 0002 20 mg propofol given by Dr. Adela LankFloyd 0003 Pt still awake 0003 20 mg propofol given by Dr. Adela LankFloyd 0003 Sedation achieved 0003 Marisue IvanLiz, PA attempted reduction 0004 3L of O2 applied to pt w/ improvement of O2 sat to 93% 0006 Pt waking up 0006 20 mg propofol given by Dr. Adela LankFloyd 0006 Pt awake 0007 20 mg propofol given by Dr. Adela LankFloyd 0007 Pt awake 0008 20 mg propofol given by Dr. Adela LankFloyd 0009 Pt sleeping 0010 Dr. Adela LankFloyd attempted to reduce elbow 0013 Unsuccessful attempt at reduction of elbow 0014 O2 removed pt maintaining o2 sat of 96% 0027 Pt awake VSS, no O2 required at this time.

## 2016-01-22 NOTE — Discharge Instructions (Signed)
1. Medications: pain medication, usual home medications 2. Treatment: rest, drink plenty of fluids, elevate, wear sling  3. Follow Up: please followup with your orthopedics for discussion of your diagnoses and further evaluation after today's visit; if you do not have a primary care doctor use the phone number listed in your discharge paperwork to find one; please return to the ER for increased pain, swelling, numbness, color change in your hands, new or worsening symptoms   Elbow Dislocation An elbow dislocation happens when your elbow bones move out of their normal place. This injury is caused by falling on an outstretched arm. Your doctor will put your elbow bones back in place. A splint or sling will be placed around your elbow. Some elbow dislocations need surgery. After surgery, your elbow may be protected with a device called an external hinge. The external hinge keeps your elbow from dislocating again when you move it. HOME CARE  Rest your injured joint. Do not move it.  Exercise your hand and fingers as told by your doctors.  Put ice on your injured joint for 1 to 2 days or as told by your doctor.  Put ice in a plastic bag.  Place a towel between your skin and the bag.  Leave the ice on for 15 to 20 minutes, every 2 hours while you are awake.  Raise (elevate) your arm above your heart as told by your doctor to help limit puffiness.  Only take medicines as told by your doctor. GET HELP RIGHT AWAY IF:  Your splint becomes damaged.  You have an external hinge, and it becomes loose or will not move.  You have an external hinge, and you see yellowish-white fluid (pus) around the pins.  Your pain becomes worse, not better.  You lose feeling in your hand or fingers. MAKE SURE YOU:   Understand these instructions.  Will watch your condition.  Will get help right away if you are not doing well or get worse.   This information is not intended to replace advice given to you  by your health care provider. Make sure you discuss any questions you have with your health care provider.   Document Released: 06/12/2011 Document Revised: 12/23/2011 Document Reviewed: 05/15/2015 Elsevier Interactive Patient Education [employer]2016 [employer] Inc[employer]

## 2016-01-22 NOTE — Consult Note (Signed)
ORTHOPAEDIC CONSULTATION  REQUESTING PHYSICIAN: Provider Default, MD  Chief Complaint: Left elbow fx dislocation  HPI: Debbie Cox is a 55 y.o. female who presents with left elbow fx dislocation s/p mechanical fall.  Patient is under the influence of ETOH therefore HPI details are limited.  She does report drinking tequila and bud light in an attempt to reduce her elbow dislocation.  She endorses severe pain and deformity of her left elbow.  Pain is nonradiating, worse with movement.  Ortho consulted due to unsuccessful reduction attempts by the ER.  Past Medical History  Diagnosis Date  . Hypertension   . ETOH abuse    History reviewed. No pertinent past surgical history. Social History   Social History  . Marital Status: Divorced    Spouse Name: N/A  . Number of Children: N/A  . Years of Education: N/A   Social History Main Topics  . Smoking status: Current Every Day Smoker    Types: Cigarettes  . Smokeless tobacco: Never Used  . Alcohol Use: Yes  . Drug Use: No  . Sexual Activity: Not Asked   Other Topics Concern  . None   Social History Narrative   No family history on file. - negative except otherwise stated in the family history section Allergies  Allergen Reactions  . Penicillins     Unknown reaction   Prior to Admission medications   Medication Sig Start Date End Date Taking? Authorizing Provider  acetaminophen-codeine (TYLENOL #2) 300-15 MG tablet Take 1 tablet by mouth every 4 (four) hours as needed for moderate pain.    Historical Provider, MD  hydrochlorothiazide (HYDRODIURIL) 25 MG tablet Take 1 tablet (25 mg total) by mouth daily. 07/20/15   Vanetta Mulders, MD  naproxen (NAPROSYN) 500 MG tablet Take 1 tablet (500 mg total) by mouth 2 (two) times daily. Patient taking differently: Take 500 mg by mouth 2 (two) times daily as needed for mild pain.  04/02/15   Antony Madura, PA-C  naproxen (NAPROSYN) 500 MG tablet Take 1 tablet (500 mg total) by mouth 2  (two) times daily. 07/20/15   Vanetta Mulders, MD   Dg Elbow 2 Views Left  01/22/2016  CLINICAL DATA:  Larey Seat, now with pain and deformity EXAM: LEFT ELBOW - 2 VIEW COMPARISON:  None. FINDINGS: There is a posterior-lateral dislocation of the elbow. There are bone fragments displaced around the joint, many of which appear to arise from the lateral epicondyle. Probable radial head fracture as well. IMPRESSION: Left elbow dislocation Electronically Signed   By: Ellery Plunk M.D.   On: 01/22/2016 00:09   Dg Shoulder Left  01/22/2016  CLINICAL DATA:  Pain and deformity after falling. EXAM: LEFT SHOULDER - 2+ VIEW COMPARISON:  None. FINDINGS: The left elbow dislocation is again evident. Remainder of the humerus is intact. No soft tissue foreign body. IMPRESSION: Left elbow dislocation Electronically Signed   By: Ellery Plunk M.D.   On: 01/22/2016 00:10   Dg Humerus Left  01/22/2016  CLINICAL DATA:  Pain and deformity after falling EXAM: LEFT HUMERUS - 2+ VIEW COMPARISON:  None. FINDINGS: Left elbow dislocation is again evident. Remainder of the humerus is intact. No soft tissue foreign body. IMPRESSION: Left elbow dislocation Electronically Signed   By: Ellery Plunk M.D.   On: 01/22/2016 00:11   - pertinent xrays, CT, MRI studies were reviewed and independently interpreted  Positive ROS: All other systems have been reviewed and were otherwise negative with the exception of those mentioned in the  HPI and as above.  Physical Exam: General: Alert, no acute distress Cardiovascular: No pedal edema Respiratory: No cyanosis, no use of accessory musculature GI: No organomegaly, abdomen is soft and non-tender Skin: No lesions in the area of chief complaint Neurologic: Sensation intact distally Psychiatric: Patient has normal mood and affect Lymphatic: No axillary or cervical lymphadenopathy  MUSCULOSKELETAL:  - obvious deformity of left elbow - compartment soft - NVI distally - bounding  pulses  Assessment: Left posteromedial elbow dislocation, radial head fracture  Plan: - reduced and splinted in the ER, elbow at 90, hand at neutral, mini c arm confirmed reduction - elbow injury is highly unstable and may need surgical treatment - ROM not tested as it's very unstable - CT scan ordered to better evaluate fracture dislocation - f/u in office this week  Thank you for the consult and the opportunity to see Ms. Unice CobbleBruce  N. Glee ArvinMichael Xu, MD [facility] [phone] 2:25 AM

## 2016-01-22 NOTE — ED Notes (Signed)
Pt called friend, coming to get her

## 2016-01-22 NOTE — ED Provider Notes (Signed)
Patient primarily seen by Dr. Adela LankFloyd and Chari ManningEmily Westfell, PA.  Was asked to perform procedural sedation for reduction by Dr. Roda ShuttersXu.  EtOH on board.  On my initial assessment, patient awake, alert. Vital signs reassuring. Presedation checklist completed.  Patient was sedated at the bedside. Patient tolerated the procedure well. Vital signs are sedation stable. Final disposition per Dr. Roda ShuttersXu.  Procedural sedation Performed by: Shon BatonHORTON, COURTNEY F Consent: Verbal consent obtained. Risks and benefits: risks, benefits and alternatives were discussed Required items: required blood products, implants, devices, and special equipment available Patient identity confirmed: arm band and provided demographic data Time out: Immediately prior to procedure a "time out" was called to verify the correct patient, procedure, equipment, support staff and site/side marked as required.  Sedation type: moderate (conscious) sedation NPO time confirmed and considedered  Sedatives: KETAMINE   Physician Time at Bedside: 20  Vitals: Vital signs were monitored during sedation. Cardiac Monitor, pulse oximeter Patient tolerance: Patient tolerated the procedure well with no immediate complications. Comments: Pt with uneventful recovered. Returned to pre-procedural sedation baseline   Shon Batonourtney F Horton, MD 01/22/16 [phone]

## 2016-01-22 NOTE — ED Notes (Signed)
Pt A/O, ham sandwich and water brought to pt, pt trying to figure out a way home.

## 2016-01-22 NOTE — ED Notes (Signed)
[id number] Dr. Wilkie AyeHorton and Dr. Theodoro Gristho preformed time out.  0200 30 mg ketamine pushed by Dr. Wilkie AyeHorton 0201 Pt not sedated 0202 40 mg ketamine pushed by Dr. Wilkie AyeHorton 0203 Pt not sedated  0203 10 mg ketamine pushed by Dr. Wilkie AyeHorton 0203 Pt awake however medication in effect, Dr. Theodoro Gristho reduced elbow using c arm 0210 Reduction complete  Pt remained verbal during the procedure.  BP did elevate.  Pt is awake and talking at this time.  Waiting on CT to confirm successful reduction.

## 2016-03-05 ENCOUNTER — Encounter (HOSPITAL_COMMUNITY): Payer: Self-pay

## 2016-03-05 ENCOUNTER — Emergency Department (HOSPITAL_COMMUNITY)
Admission: EM | Admit: 2016-03-05 | Discharge: 2016-03-05 | Disposition: A | Discharge status: Home / Self Care | Payer: Medicaid Other | Attending: Emergency Medicine | Admitting: Emergency Medicine

## 2016-03-05 ENCOUNTER — Emergency Department (HOSPITAL_COMMUNITY): Payer: Medicaid Other

## 2016-03-05 DIAGNOSIS — S52125S Nondisplaced fracture of head of left radius, sequela: Secondary | ICD-10-CM | POA: Insufficient documentation

## 2016-03-05 DIAGNOSIS — W1840XS Slipping, tripping and stumbling without falling, unspecified, sequela: Secondary | ICD-10-CM | POA: Insufficient documentation

## 2016-03-05 DIAGNOSIS — F17210 Nicotine dependence, cigarettes, uncomplicated: Secondary | ICD-10-CM | POA: Diagnosis not present

## 2016-03-05 DIAGNOSIS — S42432S Displaced fracture (avulsion) of lateral epicondyle of left humerus, sequela: Secondary | ICD-10-CM | POA: Diagnosis not present

## 2016-03-05 DIAGNOSIS — S4992XS Unspecified injury of left shoulder and upper arm, sequela: Secondary | ICD-10-CM | POA: Diagnosis present

## 2016-03-05 DIAGNOSIS — I10 Essential (primary) hypertension: Secondary | ICD-10-CM | POA: Insufficient documentation

## 2016-03-05 DIAGNOSIS — S42402S Unspecified fracture of lower end of left humerus, sequela: Secondary | ICD-10-CM

## 2016-03-05 DIAGNOSIS — T148XXA Other injury of unspecified body region, initial encounter: Secondary | ICD-10-CM

## 2016-03-05 NOTE — ED Provider Notes (Signed)
This is a shared visit with Danelle BerryLeisa Tapia, PAC BP 128/90 mmHg  Pulse 70  Temp(Src) 98.2 F (36.8 C)  Resp 18  SpO2 99%  Selinda EonRochelle Pring is a 55 y.o. female awaiting x-ray when patient turned over at shift change. Call to Dr. Roda ShuttersXu to let him know patient returned from x-ray so he could review films.  He has ordered CT scan. When patient return from CT she can be placed in sling without splint and she is to f/u in his office.   Patient returned from CT and arm sling applied. Discussed with the patient importance of f/u with Dr. Roda ShuttersXu. Patient states that she will call his office for follow up.  Stable for d/c.     [address]Hope BaldwinM Neese, NP 03/05/16 2348  Jerelyn ScottMartha Linker, MD 03/05/16 [phone]

## 2016-03-05 NOTE — ED Provider Notes (Signed)
CSN: [id number]     Arrival date & time 03/05/16  1201 History  By signing my name below, I, Ronney Lion, attest that this documentation has been prepared under the direction and in the presence of Danelle Berry, PA-C. Electronically Signed: Ronney Lion, ED Scribe. 03/05/2016. 3:36 PM.    Chief Complaint  Patient presents with  . Shoulder Pain   The history is provided by the patient and medical records. No language interpreter was used.    HPI Comments: Debbie Cox is a 55 y.o. female with a history of EtOH abuse and hypertension, who presents to the Emergency Department complaining of constant, 8/10 left shoulder pain that began 4 days ago, described as achy and sore, associated with carrying around heavy weight (w/o sling) of left long arm splint. Per chart review, patient was seen in the ED on 01/21/16 for left elbow pain s/p mechanical fall. Per medical records, a CT scan ordered at that visit showed "Anterior-medial subluxation about the elbow joint. Fractures of the lateral epicondyle, capitellum and radial head with numerous tiny comminuted fragments scattered throughout the joint." Reduction was performed by orthopedist Dr. Roda Shutters, pt was placed in long arm splint, and she was advised to follow up with Dr. Roda Shutters for possible needed surgery, however pt states she was unable to do so, due to lack of insurance. She presents to the ER requesting splint removal after 6 weeks time.  Past Medical History  Diagnosis Date  . Hypertension   . ETOH abuse    History reviewed. No pertinent past surgical history. No family history on file. Social History  Substance Use Topics  . Smoking status: Current Every Day Smoker    Types: Cigarettes  . Smokeless tobacco: Never Used  . Alcohol Use: Yes   OB History    No data available     Review of Systems  All other systems reviewed and are negative.     Allergies  Penicillins  Home Medications   Prior to Admission medications   Medication Sig  Start Date End Date Taking? Authorizing Provider  acetaminophen-codeine (TYLENOL #2) 300-15 MG tablet Take 1 tablet by mouth every 4 (four) hours as needed for moderate pain.    Historical Provider, MD  hydrochlorothiazide (HYDRODIURIL) 25 MG tablet Take 1 tablet (25 mg total) by mouth daily. 07/20/15   Vanetta Mulders, MD  HYDROcodone-acetaminophen (NORCO/VICODIN) 5-325 MG tablet Take 1-2 tablets by mouth every 4 (four) hours as needed. 01/22/16   Mady Gemma, PA-C  naproxen (NAPROSYN) 500 MG tablet Take 1 tablet (500 mg total) by mouth 2 (two) times daily. Patient taking differently: Take 500 mg by mouth 2 (two) times daily as needed for mild pain.  04/02/15   Antony Madura, PA-C  naproxen (NAPROSYN) 500 MG tablet Take 1 tablet (500 mg total) by mouth 2 (two) times daily. 07/20/15   Vanetta Mulders, MD   BP 127/97 mmHg  Pulse 57  Temp(Src) 98.2 F (36.8 C)  Resp 18  SpO2 100% Physical Exam  Constitutional: She is oriented to person, place, and time. She appears well-developed and well-nourished. No distress.  HENT:  Head: Normocephalic and atraumatic.  Eyes: Conjunctivae and EOM are normal.  Neck: Neck supple. No tracheal deviation present.  Cardiovascular: Normal rate.   Pulmonary/Chest: Effort normal. No respiratory distress.  Musculoskeletal: Normal range of motion.       Left shoulder: She exhibits tenderness.       Arms: Mild deltoid tenderness to palpation on left shoulder, no  bony tenderness, normal ROM Left arm in splint with ace wrap, normal sensation and capillary refill of left fingers, normal radial pulse  Neurological: She is alert and oriented to person, place, and time.  Skin: Skin is warm and dry.  Psychiatric: She has a normal mood and affect. Her behavior is normal.  Nursing note and vitals reviewed.   ED Course  Procedures (including critical care time)  DIAGNOSTIC STUDIES: Oxygen Saturation is 100% on RA, normal by my interpretation.    COORDINATION OF  CARE: 2:56 PM - Discussed treatment plan with pt at bedside which includes consult with orthopedist, Dr. Roda ShuttersXu, who patient had seen the night of her left elbow fracture. Pt verbalized understanding and agreed to plan.   3:36 PM - Consult with orthopedist, Dr. Roda ShuttersXu.   Labs Review Labs Reviewed - No data to display  Imaging Review No results found. I have personally reviewed and evaluated these images and lab results as part of my medical decision-making.   EKG Interpretation None      MDM   Pt with left shoulder pain and requesting removal of splint that was applied in the ER roughly 6 weeks ago for fx dislocation of left elbow, reduction and splinting performed by Dr. Roda ShuttersXu, pt did not follow up.  Consulted with Dr. Roda ShuttersXu, requested splint removal and xrays and repage him.  Xrays were taking in splint, and then repeated with splint removed.  Repeat films pending, [facility], oncoming provider, to continue care in ER, and to dispo accordingly after films and follow direction from Dr. Roda ShuttersXu.  Final diagnoses:  Elbow fracture, left, sequela  I personally performed the services described in this documentation, which was scribed in my presence. The recorded information has been reviewed and is accurate.      Danelle BerryLeisa Jatziri Goffredo, PA-C 03/15/16 1637  Arby BarretteMarcy Pfeiffer, MD 03/26/16 2329

## 2016-03-05 NOTE — ED Notes (Signed)
Pt is in stable condition upon d/c and ambulates from ED. 

## 2016-03-05 NOTE — ED Notes (Signed)
Patient here with left shoulder pain x 4 days. Denies injury. arrived with splint to left arm from elbow fracture 6 weeks ago

## 2016-03-05 NOTE — Discharge Instructions (Signed)
It is important that you call Dr. Warren DanesXU's office to schedule a follow up appointment. Wear the sling but take your arm out a few times a day. Take ibuprofen as needed for pain.

## 2016-03-05 NOTE — ED Notes (Signed)
Patient transported to x-ray. ?

## 2016-03-28 DIAGNOSIS — Z139 Encounter for screening, unspecified: Secondary | ICD-10-CM

## 2016-04-12 DIAGNOSIS — I10 Essential (primary) hypertension: Secondary | ICD-10-CM | POA: Insufficient documentation

## 2016-04-18 NOTE — Congregational Nurse Program (Signed)
Congregational Nurse Program Note  Date of Encounter: 03/28/2016  Past Medical History: Past Medical History  Diagnosis Date  . Hypertension   . ETOH abuse     Encounter Details:     CNP Questionnaire - 03/28/16 1417    Patient Demographics   Is this a new or existing patient? Existing   Patient is considered a/an Not Applicable   Race African-American/Black   Patient Assistance   Location of Patient Assistance Not Applicable   Patient's financial/insurance status Low Income;[place] [employer]s   Uninsured Patient Yes   Interventions Counseled to make appt. with provider   Patient referred to apply for the following financial assistance Not Applicable   Food insecurities addressed Provided food supplies   Transportation assistance No   Assistance securing medications No   Educational health offerings Chronic disease;Hypertension   Encounter Details   Primary purpose of visit Education/Health Concerns;Chronic Illness/Condition Visit;Navigating the Healthcare System   Was an Emergency Department visit averted? Not Applicable   Does patient have a medical provider? Yes   Patient referred to Clinic   Was a mental health screening completed? (GAINS tool) No   Does patient have dental issues? No   Does patient have vision issues? No   Does your patient have an abnormal blood pressure today? No   Since previous encounter, have you referred patient for abnormal blood pressure that resulted in a new diagnosis or medication change? No   Does your patient have an abnormal blood glucose today? No   Since previous encounter, have you referred patient for abnormal blood glucose that resulted in a new diagnosis or medication change? No   Was there a life-saving intervention made? No       B/P check 124/88

## 2016-12-12 ENCOUNTER — Encounter (HOSPITAL_COMMUNITY)
Admission: RE | Admit: 2016-12-12 | Discharge: 2016-12-12 | Disposition: A | Discharge status: Home / Self Care | Payer: Medicaid Other | Source: Ambulatory Visit | Attending: Oral Surgery | Admitting: Oral Surgery

## 2016-12-12 ENCOUNTER — Ambulatory Visit (HOSPITAL_COMMUNITY)
Admission: RE | Admit: 2016-12-12 | Discharge: 2016-12-12 | Disposition: A | Discharge status: Home / Self Care | Payer: Medicaid Other | Source: Ambulatory Visit | Attending: Anesthesiology | Admitting: Anesthesiology

## 2016-12-12 ENCOUNTER — Encounter (HOSPITAL_COMMUNITY): Payer: Self-pay

## 2016-12-12 DIAGNOSIS — K089 Disorder of teeth and supporting structures, unspecified: Secondary | ICD-10-CM | POA: Insufficient documentation

## 2016-12-12 DIAGNOSIS — Z01812 Encounter for preprocedural laboratory examination: Secondary | ICD-10-CM | POA: Diagnosis not present

## 2016-12-12 DIAGNOSIS — I10 Essential (primary) hypertension: Secondary | ICD-10-CM | POA: Insufficient documentation

## 2016-12-12 DIAGNOSIS — Z01818 Encounter for other preprocedural examination: Secondary | ICD-10-CM

## 2016-12-12 DIAGNOSIS — R001 Bradycardia, unspecified: Secondary | ICD-10-CM | POA: Insufficient documentation

## 2016-12-12 HISTORY — DX: Depression, unspecified: F32.A

## 2016-12-12 HISTORY — DX: Anxiety disorder, unspecified: F41.9

## 2016-12-12 HISTORY — DX: Bipolar disorder, unspecified: F31.9

## 2016-12-12 HISTORY — DX: Pain in unspecified joint: M25.50

## 2016-12-12 HISTORY — DX: Major depressive disorder, single episode, unspecified: F32.9

## 2016-12-12 LAB — BASIC METABOLIC PANEL
ANION GAP: 6 (ref 5–15)
BUN: 15 mg/dL (ref 6–20)
CALCIUM: 9.2 mg/dL (ref 8.9–10.3)
CO2: 26 mmol/L (ref 22–32)
CREATININE: 1.18 mg/dL — AB (ref 0.44–1.00)
Chloride: 112 mmol/L — ABNORMAL HIGH (ref 101–111)
GFR calc Af Amer: 58 mL/min — ABNORMAL LOW (ref >60)
GFR, EST NON AFRICAN AMERICAN: 50 mL/min — AB (ref >60)
GLUCOSE: 84 mg/dL (ref 65–99)
Potassium: 4.3 mmol/L (ref 3.5–5.1)
Sodium: 144 mmol/L (ref 135–145)

## 2016-12-12 LAB — CBC
HCT: 39.1 % (ref 36.0–46.0)
HEMOGLOBIN: 12.8 g/dL (ref 12.0–15.0)
MCH: 27.9 pg (ref 26.0–34.0)
MCHC: 32.7 g/dL (ref 30.0–36.0)
MCV: 85.2 fL (ref 78.0–100.0)
PLATELETS: 185 10*3/uL (ref 150–400)
RBC: 4.59 MIL/uL (ref 3.87–5.11)
RDW: 14.2 % (ref 11.5–15.5)
WBC: 7.1 10*3/uL (ref 4.0–10.5)

## 2016-12-12 NOTE — Pre-Procedure Instructions (Signed)
    Debbie Cox  12/12/2016     Your procedure is scheduled on Friday,March 9.  [facility] AM.               Your surgery or procedure is scheduled for 10:00 AM   Call this number if you have problems the morning of surgery: [phone]   Remember:  Do not eat food or drink liquids after midnight Thursday, March 8.  Take these medicines the morning of surgery with A SIP OF WATER: None                DO NOT take hydrochlorothiazide (HYDRODIURIL) the morning of Surgery.   Do not wear jewelry, make-up or nail polish.  Do not wear lotions, powders, or perfumes, or deodorant.   Do not shave 48 hours prior to surgery.    Do not bring valuables to the hospital.  [facility] is not responsible for any belongings or valuables.  Contacts, dentures or bridgework may not be worn into surgery.  Leave your suitcase in the car.  After surgery it may be brought to your room.  For patients admitted to the hospital, discharge time will be determined by your treatment team.  Patients discharged the day of surgery will not be allowed to drive home.   Name and phone number of your driver:   - friend  Special instructions:Review  [facility] - Preparing For Surgery.  Please read over the following fact sheets that you were given: Coughing and Deep Breathing, Pain Booklet

## 2016-12-18 NOTE — H&P (Signed)
HISTORY AND PHYSICAL  Debbie Cox is a 56 y.o. female patient referred by general dentist for extraction carious teeth.  No diagnosis found.  Past Medical History:  Diagnosis Date  . Anxiety   . Bipolar disorder (HCC)   . Depression   . ETOH abuse   . Hypertension   . Joint pain    hands    No current facility-administered medications for this encounter.    Current Outpatient Prescriptions  Medication Sig Dispense Refill  . hydrochlorothiazide (HYDRODIURIL) 25 MG tablet Take 1 tablet (25 mg total) by mouth daily. 14 tablet 1  . lurasidone (LATUDA) 40 MG TABS tablet Take 40 mg by mouth daily with breakfast.    . HYDROcodone-acetaminophen (NORCO/VICODIN) 5-325 MG tablet Take 1-2 tablets by mouth every 4 (four) hours as needed. (Patient not taking: Reported on 12/10/2016) 10 tablet 0  . naproxen (NAPROSYN) 500 MG tablet Take 1 tablet (500 mg total) by mouth 2 (two) times daily. (Patient not taking: Reported on 12/10/2016) 30 tablet 0  . naproxen (NAPROSYN) 500 MG tablet Take 1 tablet (500 mg total) by mouth 2 (two) times daily. (Patient not taking: Reported on 12/10/2016) 14 tablet 1   Allergies  Allergen Reactions  . Penicillins Anaphylaxis and Swelling    Tongue swelled and couldn't breathe Has patient had a PCN reaction causing immediate rash, facial/tongue/throat swelling, SOB or lightheadedness with hypotension: Yes Has patient had a PCN reaction causing severe rash involving mucus membranes or skin necrosis: No Has patient had a PCN reaction that required hospitalization No Has patient had a PCN reaction occurring within the last 10 years: Yes If all of the above answers are "NO", then may proceed with Cephalosporin use.   . Niacin And Related Hives   Active Problems:   * No active hospital problems. *  Vitals: There were no vitals taken for this visit. Lab results:No results found for this or any previous visit (from the past 24 hour(s)). Radiology Results: No results  found. General appearance: alert, cooperative and no distress Head: Normocephalic, without obvious abnormality, atraumatic Eyes: negative Nose: Nares normal. Septum midline. Mucosa normal. No drainage or sinus tenderness. Throat: carious maxillary teeth. bilateral mandibular lingual tori. pharynx clear. Neck: no adenopathy, supple, symmetrical, trachea midline and thyroid not enlarged, symmetric, no tenderness/mass/nodules Resp: clear to auscultation bilaterally Cardio: regular rate and rhythm, S1, S2 normal, no murmur, click, rub or gallop  Assessment: Multiple nonrestorable teeth secondary to dental caries. Bilateral mandibular lingual tori.  Plan: Multiple dental extractions with alveoloplasty. Removal bilateral mandibular tori. GA. Day surgery.   Georgia LopesJENSEN,Iszabella Hebenstreit M 12/18/2016

## 2016-12-20 ENCOUNTER — Encounter (HOSPITAL_COMMUNITY)
Admission: RE | Disposition: A | Discharge status: Home / Self Care | Payer: Self-pay | Source: Ambulatory Visit | Attending: Oral Surgery

## 2016-12-20 ENCOUNTER — Ambulatory Visit (HOSPITAL_COMMUNITY)
Admission: RE | Admit: 2016-12-20 | Discharge: 2016-12-20 | Disposition: A | Discharge status: Home / Self Care | Payer: Medicaid Other | Source: Ambulatory Visit | Attending: Oral Surgery | Admitting: Oral Surgery

## 2016-12-20 ENCOUNTER — Ambulatory Visit (HOSPITAL_COMMUNITY): Payer: Medicaid Other | Admitting: Certified Registered Nurse Anesthetist

## 2016-12-20 ENCOUNTER — Encounter (HOSPITAL_COMMUNITY): Payer: Self-pay | Admitting: *Deleted

## 2016-12-20 DIAGNOSIS — F17200 Nicotine dependence, unspecified, uncomplicated: Secondary | ICD-10-CM | POA: Diagnosis not present

## 2016-12-20 DIAGNOSIS — M270 Developmental disorders of jaws: Secondary | ICD-10-CM | POA: Diagnosis not present

## 2016-12-20 DIAGNOSIS — F319 Bipolar disorder, unspecified: Secondary | ICD-10-CM | POA: Insufficient documentation

## 2016-12-20 DIAGNOSIS — K029 Dental caries, unspecified: Secondary | ICD-10-CM | POA: Insufficient documentation

## 2016-12-20 DIAGNOSIS — I10 Essential (primary) hypertension: Secondary | ICD-10-CM | POA: Insufficient documentation

## 2016-12-20 HISTORY — PX: MULTIPLE EXTRACTIONS WITH ALVEOLOPLASTY: SHX5342

## 2016-12-20 SURGERY — MULTIPLE EXTRACTION WITH ALVEOLOPLASTY
Anesthesia: General | Laterality: Bilateral

## 2016-12-20 MED ORDER — EPHEDRINE SULFATE 50 MG/ML IJ SOLN
INTRAMUSCULAR | Status: DC | PRN
  Administered 2016-12-20: 10 mg via INTRAVENOUS

## 2016-12-20 MED ORDER — FENTANYL CITRATE (PF) 100 MCG/2ML IJ SOLN
INTRAMUSCULAR | Status: AC
  Filled 2016-12-20: qty 2

## 2016-12-20 MED ORDER — MIDAZOLAM HCL 2 MG/2ML IJ SOLN
INTRAMUSCULAR | Status: AC
  Filled 2016-12-20: qty 2

## 2016-12-20 MED ORDER — ONDANSETRON HCL 4 MG/2ML IJ SOLN
INTRAMUSCULAR | Status: DC | PRN
  Administered 2016-12-20: 4 mg via INTRAVENOUS

## 2016-12-20 MED ORDER — SODIUM CHLORIDE 0.9 % IV SOLN
INTRAVENOUS | Status: DC
  Administered 2016-12-20 (×2): via INTRAVENOUS

## 2016-12-20 MED ORDER — 0.9 % SODIUM CHLORIDE (POUR BTL) OPTIME
TOPICAL | Status: DC | PRN
  Administered 2016-12-20: 1000 mL

## 2016-12-20 MED ORDER — PHENYLEPHRINE 40 MCG/ML (10ML) SYRINGE FOR IV PUSH (FOR BLOOD PRESSURE SUPPORT)
PREFILLED_SYRINGE | INTRAVENOUS | Status: AC
  Filled 2016-12-20: qty 10

## 2016-12-20 MED ORDER — GLYCOPYRROLATE 0.2 MG/ML IJ SOLN
INTRAMUSCULAR | Status: DC | PRN
  Administered 2016-12-20: 0.2 mg via INTRAVENOUS

## 2016-12-20 MED ORDER — METOPROLOL TARTRATE 5 MG/5ML IV SOLN
INTRAVENOUS | Status: AC
  Filled 2016-12-20: qty 5

## 2016-12-20 MED ORDER — PROPOFOL 10 MG/ML IV BOLUS
INTRAVENOUS | Status: DC | PRN
  Administered 2016-12-20: 130 mg via INTRAVENOUS

## 2016-12-20 MED ORDER — LIDOCAINE 2% (20 MG/ML) 5 ML SYRINGE
INTRAMUSCULAR | Status: AC
Start: 2016-12-20 — End: 2016-12-20
  Filled 2016-12-20: qty 5

## 2016-12-20 MED ORDER — SUCCINYLCHOLINE CHLORIDE 20 MG/ML IJ SOLN
INTRAMUSCULAR | Status: DC | PRN
  Administered 2016-12-20: 140 mg via INTRAVENOUS

## 2016-12-20 MED ORDER — MIDAZOLAM HCL 2 MG/2ML IJ SOLN
INTRAMUSCULAR | Status: DC | PRN
  Administered 2016-12-20: 1 mg via INTRAVENOUS

## 2016-12-20 MED ORDER — LIDOCAINE HCL (CARDIAC) 20 MG/ML IV SOLN
INTRAVENOUS | Status: DC | PRN
  Administered 2016-12-20: 100 mg via INTRATRACHEAL

## 2016-12-20 MED ORDER — SODIUM CHLORIDE 0.9 % IR SOLN
Status: DC | PRN
  Administered 2016-12-20: 1000 mL

## 2016-12-20 MED ORDER — PROPOFOL 10 MG/ML IV BOLUS
INTRAVENOUS | Status: AC
  Filled 2016-12-20: qty 20

## 2016-12-20 MED ORDER — FENTANYL CITRATE (PF) 100 MCG/2ML IJ SOLN
25.0000 ug | INTRAMUSCULAR | Status: DC | PRN
  Administered 2016-12-20 (×2): 50 ug via INTRAVENOUS

## 2016-12-20 MED ORDER — EPHEDRINE 5 MG/ML INJ
INTRAVENOUS | Status: AC
  Filled 2016-12-20: qty 10

## 2016-12-20 MED ORDER — FENTANYL CITRATE (PF) 100 MCG/2ML IJ SOLN
INTRAMUSCULAR | Status: DC | PRN
  Administered 2016-12-20: 50 ug via INTRAVENOUS
  Administered 2016-12-20 (×2): 25 ug via INTRAVENOUS

## 2016-12-20 MED ORDER — OXYMETAZOLINE HCL 0.05 % NA SOLN
NASAL | Status: AC
  Filled 2016-12-20: qty 15

## 2016-12-20 MED ORDER — OXYMETAZOLINE HCL 0.05 % NA SOLN
NASAL | Status: DC | PRN
  Administered 2016-12-20: 3 via NASAL

## 2016-12-20 MED ORDER — LIDOCAINE-EPINEPHRINE 2 %-1:100000 IJ SOLN
INTRAMUSCULAR | Status: AC
  Filled 2016-12-20: qty 1

## 2016-12-20 MED ORDER — ALBUTEROL SULFATE HFA 108 (90 BASE) MCG/ACT IN AERS
INHALATION_SPRAY | RESPIRATORY_TRACT | Status: AC
  Filled 2016-12-20: qty 6.7

## 2016-12-20 MED ORDER — CLINDAMYCIN HCL 300 MG PO CAPS: 300.0000 mg | ORAL_CAPSULE | Freq: Three times a day (TID) | ORAL | 0 refills | Status: DC

## 2016-12-20 MED ORDER — OXYCODONE-ACETAMINOPHEN 10-325 MG PO TABS: 1.0000 | ORAL_TABLET | ORAL | 0 refills | Status: DC | PRN

## 2016-12-20 MED ORDER — LIDOCAINE-EPINEPHRINE 2 %-1:100000 IJ SOLN
INTRAMUSCULAR | Status: DC | PRN
  Administered 2016-12-20: 30 mL via INTRADERMAL

## 2016-12-20 MED ORDER — PHENYLEPHRINE HCL 10 MG/ML IJ SOLN
INTRAMUSCULAR | Status: DC | PRN
  Administered 2016-12-20: 80 ug via INTRAVENOUS

## 2016-12-20 SURGICAL SUPPLY — 31 items
BUR CROSS CUT FISSURE 1.6 (BURR) ×2 IMPLANT
BUR CROSS CUT FISSURE 1.6MM (BURR) ×1
BUR EGG ELITE 4.0 (BURR) ×2 IMPLANT
BUR EGG ELITE 4.0MM (BURR) ×1
CANISTER SUCT 3000ML PPV (MISCELLANEOUS) ×3 IMPLANT
COVER SURGICAL LIGHT HANDLE (MISCELLANEOUS) ×3 IMPLANT
CRADLE DONUT ADULT HEAD (MISCELLANEOUS) ×3 IMPLANT
DECANTER SPIKE VIAL GLASS SM (MISCELLANEOUS) ×3 IMPLANT
DRAPE U-SHAPE 76X120 STRL (DRAPES) ×3 IMPLANT
FLUID NSS /IRRIG 1000 ML XXX (MISCELLANEOUS) ×3 IMPLANT
GAUZE PACKING FOLDED 2  STR (GAUZE/BANDAGES/DRESSINGS) ×2
GAUZE PACKING FOLDED 2 STR (GAUZE/BANDAGES/DRESSINGS) ×1 IMPLANT
GLOVE BIO SURGEON STRL SZ 6.5 (GLOVE) ×2 IMPLANT
GLOVE BIO SURGEON STRL SZ7.5 (GLOVE) ×3 IMPLANT
GLOVE BIO SURGEONS STRL SZ 6.5 (GLOVE) ×1
GLOVE BIOGEL PI IND STRL 7.0 (GLOVE) IMPLANT
GLOVE BIOGEL PI INDICATOR 7.0 (GLOVE)
GOWN STRL REUS W/ TWL LRG LVL3 (GOWN DISPOSABLE) ×1 IMPLANT
GOWN STRL REUS W/ TWL XL LVL3 (GOWN DISPOSABLE) ×1 IMPLANT
GOWN STRL REUS W/TWL LRG LVL3 (GOWN DISPOSABLE) ×2
GOWN STRL REUS W/TWL XL LVL3 (GOWN DISPOSABLE) ×2
KIT BASIN OR (CUSTOM PROCEDURE TRAY) ×3 IMPLANT
KIT ROOM TURNOVER OR (KITS) ×3 IMPLANT
NEEDLE 22X1 1/2 (OR ONLY) (NEEDLE) ×6 IMPLANT
NS IRRIG 1000ML POUR BTL (IV SOLUTION) ×3 IMPLANT
PAD ARMBOARD 7.5X6 YLW CONV (MISCELLANEOUS) ×3 IMPLANT
SUT CHROMIC 3 0 PS 2 (SUTURE) ×6 IMPLANT
SYR CONTROL 10ML LL (SYRINGE) ×3 IMPLANT
TRAY ENT MC OR (CUSTOM PROCEDURE TRAY) ×3 IMPLANT
TUBING IRRIGATION (MISCELLANEOUS) ×3 IMPLANT
YANKAUER SUCT BULB TIP NO VENT (SUCTIONS) ×3 IMPLANT

## 2016-12-20 NOTE — Anesthesia Postprocedure Evaluation (Signed)
Anesthesia Post Note  Patient: Debbie Cox  Procedure(s) Performed: Procedure(s) (LRB): BILATERAL TORI (Bilateral)  Patient location during evaluation: PACU Anesthesia Type: General Level of consciousness: awake Pain management: satisfactory to patient Vital Signs Assessment: post-procedure vital signs reviewed and stable Respiratory status: spontaneous breathing Cardiovascular status: stable Anesthetic complications: no       Last Vitals:  Vitals:   12/20/16 1200 12/20/16 1215  BP: (!) 138/91 132/82  Pulse: (!) 58 (!) 55  Resp: 12 12  Temp:  36.4 C    Last Pain:  Vitals:   12/20/16 1145  TempSrc:   PainSc: Asleep                 Prerana Strayer EDWARD

## 2016-12-20 NOTE — Op Note (Signed)
12/20/2016  11:13 AM  PATIENT:  Selinda Eonochelle [place]et  56 y.o. female  PRE-OPERATIVE DIAGNOSIS:  non restorable teeth # 2, 3, 6, 7, 8, 14, 15, BILATERAL MANDIBULAR LINGUAL TORI  POST-OPERATIVE DIAGNOSIS:  SAME  PROCEDURE:  Procedure(s): eXTRACTION eeth # 2, 3, 6, 7, 8, 14, 15, Alveoloplasty right and left maxilla, removal BILATERAL TORI  SURGEON:  Surgeon(s): Ocie DoyneScott Jerri Hargadon, DDS  ANESTHESIA:   local and general  EBL:  minimal  DRAINS: none   SPECIMEN:  No Specimen  COUNTS:  YES  PLAN OF CARE: Discharge to home after PACU  PATIENT DISPOSITION:  PACU - hemodynamically stable.   PROCEDURE DETAILS: Dictation # [id number]  Georgia LopesScott M. Erich Kochan, DMD 12/20/2016 11:13 AM

## 2016-12-20 NOTE — Transfer of Care (Signed)
Immediate Anesthesia Transfer of Care Note  Patient: Debbie Cox  Procedure(s) Performed: Procedure(s): BILATERAL TORI (Bilateral)  Patient Location: PACU  Anesthesia Type:General  Level of Consciousness: awake, oriented and patient cooperative  Airway & Oxygen Therapy: Patient Spontanous Breathing and Patient connected to face mask oxygen  Post-op Assessment: Report given to RN, Post -op Vital signs reviewed and stable, Patient moving all extremities X 4 and Patient able to stick tongue midline  Post vital signs: Reviewed and stable  Last Vitals:  Vitals:   12/20/16 0813 12/20/16 1126  BP: (!) 143/98 (!) 159/72  Pulse: 66 71  Resp: 18 18  Temp: 36.7 C 36.3 C    Last Pain:  Vitals:   12/20/16 0813  TempSrc: Oral         Complications: No apparent anesthesia complications

## 2016-12-20 NOTE — Op Note (Signed)
NAME:  Debbie Cox, Debbie Cox                   ACCOUNT NO.:  MEDICAL RECORD NO.:  [id number]  LOCATION:                                 FACILITY:  PHYSICIAN:  Georgia LopesScott M. Tereasa Yilmaz, M.D.       DATE OF BIRTH:  DATE OF PROCEDURE:  12/20/2016 DATE OF DISCHARGE:                              OPERATIVE REPORT   PREOPERATIVE DIAGNOSES:  Nonrestorable teeth secondary to dental caries; periodontal disease numbers 2, 3, 6, 7, 8, 14, 15; bilateral mandibular lingual tori.  POSTOPERATIVE DIAGNOSES:  Nonrestorable teeth secondary to dental caries; periodontal disease numbers 2, 3, 6, 7, 8, 14, 15; bilateral mandibular lingual tori.  PROCEDURE:  Extraction of teeth numbers 2, 3, 6, 7, 8, 14, 15; alveoplasty right and left maxilla; removal of bilateral mandibular lingual tori.  ANESTHESIA:  General, nasal intubation, Dr. Cristela BlueKyle Jackson, attending.  DESCRIPTION OF PROCEDURE:  The patient was taken to the operating room and placed on the table in supine position.  General anesthesia was administered intravenously and a nasal endotracheal tube was placed and secured.  The eyes were protected and the patient was draped for the procedure.  Time-out was performed.  The posterior pharynx was suctioned.  A throat pack was placed.  A 2% lidocaine with 1:100,000 epinephrine was infiltrated in an inferior alveolar block on the right and left side, and buccal and palatal infiltration in the maxilla around the teeth to be removed.  Additional anesthetic solution was deposited along the lingual tori on both right and left side to aid in hemostasis. A bite block was placed on the right side of the mouth and a Sweetheart retractor was used to retract the tongue.  A 15 blade used to make an incision around teeth numbers 14 and 15 in the posterior left maxilla, both buccally and lingually around these teeth.  In the mandible, an incision was made lingual beginning in the midline at tooth #24 and carried along the  gingival sulcus until the most posterior tooth #20 was encountered.  Then, an incision was made along the alveolar crest to the posterior border of the mandible.  Then, the periosteum was reflected around the teeth and from the lingual aspect of the mandible to expose the alveolar lingual torus.  Then, a 301 elevator was used to elevate teeth numbers 14 and 15, and the teeth were removed from the mouth with the universal forceps.  The socket was curetted.  Granulation tissue was removed from around tooth #14 and then bone was removed using a round bur and bone file to perform alveoplasty.  Then in the mandible, a Seldin retractor was used to retract the lingual tissues.  A Stryker handpiece with a fissure bur was used to make a guide holes at the junction of the lingual torus and the lingual border of the mandible. These were connected and then a curved osteotome was used with mallet to cleave the lingual exostosis from the mandible.  This resulted in a segment approximately 4 cm x 2 cm.  Then, the egg-shaped bur and bone file were used to perform smoothing of the lingual bone, where these tori had been removed.  Then,  the areas were irrigated and closed with 3- 0 chromic.  The bite block and Sweetheart retractor were used to reposition at the other side of the mouth, and then a 15-blade was used to make an incision around teeth numbers 2 and 3.  Then, carried forward along the crest of the ridge to teeth numbers 6, 7, and 8.  The periosteum was reflected from around these teeth.  The teeth were elevated and removed with the dental forceps.  Then, the periosteum was further reflected to expose the alveolar crest and alveoplasty was performed with the egg-shaped bur and with a bone file.  Then, the area was irrigated and closed with 3-0 chromic.  Then in the mandible, a 15- blade was used to make an incision beginning at the midline lingual in the gingival sulcus along tooth #25 and carried  posteriorly to tooth #29 and then the incision was carried to the posterior border of the mandible along the alveolar crest using the 15-blade.  The periosteum was reflected, taking care not to tear the mucosal tissue.  The tori were exposed and then the Seldin retractor was used to retract the lingual tissues.  Then, the fissure bur was used to make a guide holes and then these holes were connected so that the tori were separated from the mandible.  Then, the curved osteotome with mallet was used to cleave the tori.  The tori were removed, then another segment approximately the same size of the one on the left side was removed from the mouth.  Then, the egg-shaped bur and bone file were used to smooth the remaining bone, and then the area was irrigated and closed with 3-0 chromic.  The oral cavity was then inspected and found to have good contour, hemostasis, and closure.  The oral cavity was irrigated and suctioned.  Throat pack was removed.  The patient was left in care of Anesthesia for awakening and transportation to recovery room and discharged to home.  ESTIMATED BLOOD LOSS:  Minimal.  COMPLICATIONS:  None.  SPECIMENS:  None.     Georgia Lopes, M.D.     SMJ/MEDQ  D:  12/20/2016  T:  12/20/2016  Job:  [id number]

## 2016-12-20 NOTE — Anesthesia Procedure Notes (Signed)
Procedure Name: Intubation Date/Time: 12/20/2016 10:21 AM Performed by: Lyndle Herrlich Pre-anesthesia Checklist: Patient identified, Emergency Drugs available, Suction available and Patient being monitored Patient Re-evaluated:Patient Re-evaluated prior to inductionOxygen Delivery Method: Circle system utilized Preoxygenation: Pre-oxygenation with 100% oxygen Intubation Type: IV induction Ventilation: Mask ventilation without difficulty Laryngoscope Size: Mac and 3 Grade View: Grade II Nasal Tubes: Right and Magill forceps- large, utilized Tube size: 6.5 mm Number of attempts: 1 Placement Confirmation: ETT inserted through vocal cords under direct vision,  positive ETCO2 and breath sounds checked- equal and bilateral Tube secured with: Tape Dental Injury: Teeth and Oropharynx as per pre-operative assessment and Bloody posterior oropharynx

## 2016-12-20 NOTE — H&P (Signed)
H&P documentation  -History and Physical Reviewed  -Patient has been re-examined  -No change in the plan of care  Debbie Cox M  

## 2016-12-20 NOTE — Anesthesia Preprocedure Evaluation (Signed)
Anesthesia Evaluation  Patient identified by MRN, date of birth, ID band Patient awake    Reviewed: Allergy & Precautions, H&P , Patient's Chart, lab work & pertinent test results, reviewed documented beta blocker date and time   Airway Mallampati: II  TM Distance: >3 FB Neck ROM: full    Dental no notable dental hx.    Pulmonary Current Smoker,    Pulmonary exam normal breath sounds clear to auscultation       Cardiovascular hypertension,  Rhythm:regular Rate:Normal     Neuro/Psych    GI/Hepatic   Endo/Other    Renal/GU      Musculoskeletal   Abdominal   Peds  Hematology   Anesthesia Other Findings   Reproductive/Obstetrics                             Anesthesia Physical Anesthesia Plan  ASA: II  Anesthesia Plan: General   Post-op Pain Management:    Induction: Intravenous  Airway Management Planned: Nasal ETT  Additional Equipment:   Intra-op Plan:   Post-operative Plan: Extubation in OR  Informed Consent: I have reviewed the patients History and Physical, chart, labs and discussed the procedure including the risks, benefits and alternatives for the proposed anesthesia with the patient or authorized representative who has indicated his/her understanding and acceptance.   Dental Advisory Given and Dental advisory given  Plan Discussed with: CRNA and Surgeon  Anesthesia Plan Comments: (  Discussed general anesthesia, including possible nausea, instrumentation of airway, sore throat,pulmonary aspiration, etc. I asked if the were any outstanding questions, or  concerns before we proceeded.)        Anesthesia Quick Evaluation

## 2016-12-21 ENCOUNTER — Encounter (HOSPITAL_COMMUNITY): Payer: Self-pay | Admitting: Oral Surgery

## 2017-04-25 NOTE — Anesthesia Postprocedure Evaluation (Signed)
Anesthesia Post Note  Patient: Debbie Cox  Procedure(s) Performed: Procedure(s) (LRB): BILATERAL TORI (Bilateral)     Anesthesia Post Evaluation  Last Vitals:  Vitals:   12/20/16 1200 12/20/16 1215  BP: (!) 138/91 132/82  Pulse: (!) 58 (!) 55  Resp: 12 12  Temp:  36.4 C    Last Pain:  Vitals:   12/20/16 1145  TempSrc:   PainSc: Asleep                 Chevon Laufer EDWARD

## 2017-04-25 NOTE — Addendum Note (Signed)
Addendum  created 04/25/17 1458 by Cristela BlueJackson, Breyonna Nault, MD   Sign clinical note

## 2017-08-13 MED FILL — BUPROPION HCL XL 150 MG TAB: 150 | 30 days supply | Qty: 30 | Fill #0

## 2017-08-13 MED FILL — DOXYCYCLINE 100 MG TABLET: 100 | 21 days supply | Qty: 21 | Fill #0

## 2017-08-13 MED FILL — AMLODIPINE BESYLATE 10 MG T: 10 | 30 days supply | Qty: 30 | Fill #0

## 2017-08-13 MED FILL — HYDROXYZINE PAM 50 MG CAP: 50 | 15 days supply | Qty: 30 | Fill #0

## 2017-08-13 MED FILL — METHOCARBAMOL 500 MG TABS: 500 | 22 days supply | Qty: 90 | Fill #0

## 2018-03-29 ENCOUNTER — Emergency Department (HOSPITAL_COMMUNITY)
Admission: EM | Admit: 2018-03-29 | Discharge: 2018-03-29 | Disposition: A | Discharge status: Home / Self Care | Payer: Medicaid Other | Attending: Emergency Medicine | Admitting: Emergency Medicine

## 2018-03-29 ENCOUNTER — Encounter (HOSPITAL_COMMUNITY): Payer: Self-pay | Admitting: Nurse Practitioner

## 2018-03-29 DIAGNOSIS — I10 Essential (primary) hypertension: Secondary | ICD-10-CM | POA: Insufficient documentation

## 2018-03-29 DIAGNOSIS — Z79899 Other long term (current) drug therapy: Secondary | ICD-10-CM | POA: Insufficient documentation

## 2018-03-29 DIAGNOSIS — K5900 Constipation, unspecified: Secondary | ICD-10-CM | POA: Diagnosis not present

## 2018-03-29 DIAGNOSIS — F17210 Nicotine dependence, cigarettes, uncomplicated: Secondary | ICD-10-CM | POA: Insufficient documentation

## 2018-03-29 DIAGNOSIS — R1084 Generalized abdominal pain: Secondary | ICD-10-CM | POA: Diagnosis present

## 2018-03-29 LAB — URINALYSIS, ROUTINE W REFLEX MICROSCOPIC
Bilirubin Urine: NEGATIVE
GLUCOSE, UA: NEGATIVE mg/dL
KETONES UR: NEGATIVE mg/dL
Nitrite: NEGATIVE
PROTEIN: NEGATIVE mg/dL
Specific Gravity, Urine: 1.003 — ABNORMAL LOW (ref 1.005–1.030)
pH: 5 (ref 5.0–8.0)

## 2018-03-29 MED ORDER — DOCUSATE SODIUM 250 MG PO CAPS: 250.0000 mg | ORAL_CAPSULE | Freq: Every day | ORAL | 0 refills | Status: DC

## 2018-03-29 NOTE — ED Notes (Signed)
Pt could not be located to update vital signs

## 2018-03-29 NOTE — ED Notes (Signed)
Patient not found in room when discharging.

## 2018-03-29 NOTE — Discharge Instructions (Signed)
Return if any problems.

## 2018-03-29 NOTE — ED Notes (Signed)
Pt stated that she was not going to have blood work.

## 2018-03-29 NOTE — ED Triage Notes (Signed)
Pt is c/o low back/CVA pain associated with malodorous urine per pt report. Onset Thursday, denies dysuria.

## 2018-03-29 NOTE — ED Provider Notes (Signed)
[facility]-EMERGENCY DEPT Provider Note   CSN: [id number] Arrival date & time: 03/29/18  1638     History   Chief Complaint Chief Complaint  Patient presents with  . Flank Pain  . Back Pain    HPI Debbie Cox is a 57 y.o. female.  The history is provided by the patient. No language interpreter was used.  Abdominal Pain   This is a new problem. Episode onset: 3 days. The problem occurs constantly. The problem has not changed since onset.The pain is located in the generalized abdominal region. The pain is moderate. The symptoms are aggravated by urination. Nothing relieves the symptoms.   Pt reports she has been constipated x 3 days.  Pt also reports discomfort with urination.  Pt reports she had a bowel movement just before she was placed in exam room.  Pt reports all symptoms are resolved.  Pt reports no abdominal pain.  No back pain.   Past Medical History:  Diagnosis Date  . Anxiety   . Bipolar disorder (HCC)   . Depression   . ETOH abuse   . Hypertension   . Joint pain    hands    There are no active problems to display for this patient.   Past Surgical History:  Procedure Laterality Date  . CESAREAN SECTION    . MULTIPLE EXTRACTIONS WITH ALVEOLOPLASTY Bilateral 12/20/2016   Procedure: BILATERAL TORI;  Surgeon: Ocie DoyneScott Jensen, DDS;  Location: [facility] OR;  Service: Oral Surgery;  Laterality: Bilateral;     OB History   None      Home Medications    Prior to Admission medications   Medication Sig Start Date End Date Taking? Authorizing Provider  Asenapine Maleate (SAPHRIS) 10 MG SUBL Place 1 tablet under the tongue daily.   Yes [provider]  ibuprofen (ADVIL,MOTRIN) 200 MG tablet Take 400 mg by mouth every 6 (six) hours as needed for moderate pain.   Yes [provider]  lurasidone (LATUDA) 80 MG TABS tablet Take 80 mg by mouth daily with breakfast.   Yes [provider]  hydrochlorothiazide (HYDRODIURIL) 25 MG  tablet Take 1 tablet (25 mg total) by mouth daily. 07/20/15   Vanetta MuldersZackowski, Scott, MD    Family History Family History  Adopted: Yes    Social History Social History   Tobacco Use  . Smoking status: Current Every Day Smoker    Packs/day: 0.50    Years: 35.00    Pack years: 17.50    Types: Cigarettes  . Smokeless tobacco: Never Used  Substance Use Topics  . Alcohol use: Yes    Alcohol/week: 7.2 oz    Types: 12 Glasses of wine per week  . Drug use: No     Allergies   Penicillins and Niacin and related   Review of Systems Review of Systems  Gastrointestinal: Positive for abdominal pain.  All other systems reviewed and are negative.    Physical Exam Updated Vital Signs BP 137/88 Comment: Recheck  Pulse 76   Temp 98.5 F (36.9 C) (Oral)   Resp 20   SpO2 99%   Physical Exam  Constitutional: She is oriented to person, place, and time. She appears well-developed and well-nourished.  HENT:  Head: Normocephalic.  Eyes: EOM are normal.  Neck: Normal range of motion.  Pulmonary/Chest: Effort normal.  Abdominal: Soft. Bowel sounds are normal. She exhibits no distension. There is no tenderness.  Musculoskeletal: Normal range of motion.  Neurological: She is alert and oriented  to person, place, and time.  Skin: Skin is warm.  Psychiatric: She has a normal mood and affect.  Nursing note and vitals reviewed.    ED Treatments / Results  Labs (all labs ordered are listed, but only abnormal results are displayed) Labs Reviewed  URINALYSIS, ROUTINE W REFLEX MICROSCOPIC - Abnormal; Notable for the following components:      Result Value   Color, Urine STRAW (*)    Specific Gravity, Urine 1.003 (*)    Hgb urine dipstick MODERATE (*)    Leukocytes, UA SMALL (*)    Bacteria, UA RARE (*)    All other components within normal limits  URINE CULTURE    EKG None  Radiology No results found.  Procedures Procedures (including critical care time)  Medications Ordered  in ED Medications - No data to display   Initial Impression / Assessment and Plan / ED Course  I have reviewed the triage vital signs and the nursing notes.  Pertinent labs & imaging results that were available during my care of the patient were reviewed by me and considered in my medical decision making (see chart for details).     MDM  ua normal.   Pt left before discharge instruction.  Pt told RN she was better and did not feel like she needed to be here   Final Clinical Impressions(s) / ED Diagnoses   Final diagnoses:  Constipation, unspecified constipation type    ED Discharge Orders    None     Pt did not get her AVS   Osie Cheeks 03/29/18 2049    Mancel Bale, MD 04/01/18 1523

## 2018-03-31 LAB — URINE CULTURE: CULTURE: NO GROWTH

## 2018-10-12 ENCOUNTER — Emergency Department (HOSPITAL_COMMUNITY)
Admission: EM | Admit: 2018-10-12 | Discharge: 2018-10-12 | Discharge status: Against Medical Advice | Payer: Medicaid Other
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
## 2018-10-12 NOTE — ED Notes (Signed)
Pt not seen in lobby since 0100hrs Called pt x3 No response

## 2018-10-12 NOTE — ED Notes (Signed)
Called pt for triage x1 No response 

## 2021-03-21 ENCOUNTER — Encounter: Payer: Self-pay | Admitting: *Deleted

## 2021-03-21 ENCOUNTER — Other Ambulatory Visit: Payer: Self-pay | Admitting: Critical Care Medicine

## 2021-03-21 ENCOUNTER — Encounter: Payer: Self-pay | Admitting: Critical Care Medicine

## 2021-03-21 ENCOUNTER — Other Ambulatory Visit (HOSPITAL_COMMUNITY): Payer: Self-pay

## 2021-03-21 DIAGNOSIS — K08109 Complete loss of teeth, unspecified cause, unspecified class: Secondary | ICD-10-CM

## 2021-03-21 DIAGNOSIS — M79672 Pain in left foot: Secondary | ICD-10-CM

## 2021-03-21 MED ORDER — VALSARTAN-HYDROCHLOROTHIAZIDE 160-25 MG PO TABS
1.0000 | ORAL_TABLET | Freq: Every day | ORAL | 0 refills | Status: DC
  Filled 2021-03-21 (×2): qty 30, 30d supply, fill #0
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
## 2021-03-22 ENCOUNTER — Other Ambulatory Visit (HOSPITAL_COMMUNITY): Payer: Self-pay

## 2021-04-06 ENCOUNTER — Ambulatory Visit: Payer: Medicaid Other | Admitting: Podiatry

## 2021-04-06 ENCOUNTER — Other Ambulatory Visit: Payer: Self-pay | Admitting: Podiatry

## 2021-04-06 DIAGNOSIS — M79672 Pain in left foot: Secondary | ICD-10-CM

## 2021-04-10 ENCOUNTER — Telehealth: Payer: Self-pay | Admitting: *Deleted

## 2021-04-10 NOTE — Telephone Encounter (Signed)
Patient is calling to cancel her upcoming appointment for Friday July 1st She has tested positive for covid ,will call back to reschedule.

## 2021-04-13 ENCOUNTER — Ambulatory Visit: Payer: Medicaid Other | Admitting: Podiatry

## 2021-04-25 ENCOUNTER — Other Ambulatory Visit: Payer: Self-pay | Admitting: Critical Care Medicine

## 2021-04-25 ENCOUNTER — Encounter: Payer: Self-pay | Admitting: Critical Care Medicine

## 2021-04-25 MED ORDER — VALSARTAN-HYDROCHLOROTHIAZIDE 160-25 MG PO TABS: 1.0000 | ORAL_TABLET | Freq: Every day | ORAL | 0 refills | Status: DC

## 2021-04-25 MED ORDER — LURASIDONE HCL 40 MG PO TABS: 40.0000 mg | ORAL_TABLET | Freq: Every day | ORAL | 1 refills | Status: DC

## 2021-04-25 NOTE — Progress Notes (Signed)
Patient ID: Debbie Cox, female   DOB: 1961-09-09, 60 y.o.   MRN: [id number] Pt needs teeth fixed.   Eyes are an issue.  BP was high  was on BP meds. Not taking daily.      130/90

## 2021-04-26 NOTE — Progress Notes (Signed)
Patient ID: Debbie Cox, female   DOB: July 16, 1961, 60 y.o.   MRN: [id number] Is a 60 year old female seen today in the [facility].  Patient's been a resident here for some time.  She just received her first COVID-vaccine at today.  The patient comes in today with complaints of blood pressure poorly controlled and she has been out of medications.  Previously had been on valsartan HCT but has been taking it irregularly.  She also has seen a therapist in [place] but currently does not have a medication management physician.  She takes Jordan but is out of this now.  She has been on Paxil Seroquel and respite all is well in the past.  This patient comes in in somewhat of a push of speech agitated state.  On exam blood pressure 170/103 patient is mildly agitated  Chest and heart exam is unremarkable  Impression is that of bipolar disorder not well controlled and blood pressure not well controlled  Plan is to prescribe valsartan and HCT 160/25 refill given and sent to her local pharmacy.  Note the patient has transportation and note the patient does have Medicaid  Plan also is to refill her Latuda at 40 mg daily and we will endeavor to see if we can get her connected to a new mental health provider

## 2021-05-22 DIAGNOSIS — Z720 Tobacco use: Secondary | ICD-10-CM | POA: Insufficient documentation

## 2021-05-22 DIAGNOSIS — F319 Bipolar disorder, unspecified: Secondary | ICD-10-CM | POA: Insufficient documentation

## 2021-05-22 DIAGNOSIS — R7689 Other specified abnormal immunological findings in serum: Secondary | ICD-10-CM | POA: Insufficient documentation

## 2021-05-22 DIAGNOSIS — A5901 Trichomonal vulvovaginitis: Secondary | ICD-10-CM | POA: Insufficient documentation

## 2021-05-22 DIAGNOSIS — M2550 Pain in unspecified joint: Secondary | ICD-10-CM | POA: Insufficient documentation

## 2021-05-30 ENCOUNTER — Other Ambulatory Visit: Payer: Self-pay | Admitting: Nurse Practitioner

## 2021-05-30 DIAGNOSIS — Z1231 Encounter for screening mammogram for malignant neoplasm of breast: Secondary | ICD-10-CM

## 2021-06-25 ENCOUNTER — Other Ambulatory Visit: Payer: Self-pay

## 2021-06-25 ENCOUNTER — Other Ambulatory Visit (HOSPITAL_COMMUNITY)
Admission: EM | Admit: 2021-06-25 | Discharge: 2021-06-29 | Disposition: A | Discharge status: Home / Self Care | Payer: Medicaid Other | Attending: Psychiatry | Admitting: Psychiatry

## 2021-06-25 ENCOUNTER — Encounter (HOSPITAL_COMMUNITY): Payer: Self-pay | Admitting: Psychiatry

## 2021-06-25 DIAGNOSIS — F1020 Alcohol dependence, uncomplicated: Secondary | ICD-10-CM

## 2021-06-25 DIAGNOSIS — F4310 Post-traumatic stress disorder, unspecified: Secondary | ICD-10-CM

## 2021-06-25 DIAGNOSIS — Z9114 Patient's other noncompliance with medication regimen: Secondary | ICD-10-CM | POA: Insufficient documentation

## 2021-06-25 DIAGNOSIS — Z20822 Contact with and (suspected) exposure to covid-19: Secondary | ICD-10-CM | POA: Insufficient documentation

## 2021-06-25 DIAGNOSIS — F3130 Bipolar disorder, current episode depressed, mild or moderate severity, unspecified: Secondary | ICD-10-CM

## 2021-06-25 DIAGNOSIS — Z79899 Other long term (current) drug therapy: Secondary | ICD-10-CM | POA: Insufficient documentation

## 2021-06-25 DIAGNOSIS — Z5901 Sheltered homelessness: Secondary | ICD-10-CM | POA: Insufficient documentation

## 2021-06-25 DIAGNOSIS — F1421 Cocaine dependence, in remission: Secondary | ICD-10-CM | POA: Diagnosis not present

## 2021-06-25 DIAGNOSIS — F333 Major depressive disorder, recurrent, severe with psychotic symptoms: Secondary | ICD-10-CM

## 2021-06-25 DIAGNOSIS — F314 Bipolar disorder, current episode depressed, severe, without psychotic features: Secondary | ICD-10-CM | POA: Diagnosis present

## 2021-06-25 DIAGNOSIS — N760 Acute vaginitis: Secondary | ICD-10-CM | POA: Insufficient documentation

## 2021-06-25 DIAGNOSIS — R45851 Suicidal ideations: Secondary | ICD-10-CM | POA: Insufficient documentation

## 2021-06-25 DIAGNOSIS — F17210 Nicotine dependence, cigarettes, uncomplicated: Secondary | ICD-10-CM | POA: Insufficient documentation

## 2021-06-25 DIAGNOSIS — Z880 Allergy status to penicillin: Secondary | ICD-10-CM | POA: Insufficient documentation

## 2021-06-25 DIAGNOSIS — B9689 Other specified bacterial agents as the cause of diseases classified elsewhere: Secondary | ICD-10-CM

## 2021-06-25 LAB — CBC WITH DIFFERENTIAL/PLATELET
Abs Immature Granulocytes: 0.01 10*3/uL (ref 0.00–0.07)
Basophils Absolute: 0 10*3/uL (ref 0.0–0.1)
Basophils Relative: 1 %
Eosinophils Absolute: 0.1 10*3/uL (ref 0.0–0.5)
Eosinophils Relative: 2 %
HCT: 39.4 % (ref 36.0–46.0)
Hemoglobin: 12.8 g/dL (ref 12.0–15.0)
Immature Granulocytes: 0 %
Lymphocytes Relative: 24 %
Lymphs Abs: 1.4 10*3/uL (ref 0.7–4.0)
MCH: 28.8 pg (ref 26.0–34.0)
MCHC: 32.5 g/dL (ref 30.0–36.0)
MCV: 88.7 fL (ref 80.0–100.0)
Monocytes Absolute: 0.4 10*3/uL (ref 0.1–1.0)
Monocytes Relative: 7 %
Neutro Abs: 3.9 10*3/uL (ref 1.7–7.7)
Neutrophils Relative %: 66 %
Platelets: 167 10*3/uL (ref 150–400)
RBC: 4.44 MIL/uL (ref 3.87–5.11)
RDW: 14.3 % (ref 11.5–15.5)
WBC: 6 10*3/uL (ref 4.0–10.5)
nRBC: 0 % (ref 0.0–0.2)

## 2021-06-25 LAB — POC SARS CORONAVIRUS 2 AG: SARSCOV2ONAVIRUS 2 AG: NEGATIVE

## 2021-06-25 LAB — ETHANOL: Alcohol, Ethyl (B): 12 mg/dL — ABNORMAL HIGH (ref <10)

## 2021-06-25 LAB — RESP PANEL BY RT-PCR (FLU A&B, COVID) ARPGX2
Influenza A by PCR: NEGATIVE
Influenza B by PCR: NEGATIVE
SARS Coronavirus 2 by RT PCR: NEGATIVE

## 2021-06-25 LAB — POCT URINE DRUG SCREEN - MANUAL ENTRY (I-SCREEN)
POC Amphetamine UR: NOT DETECTED
POC Buprenorphine (BUP): NOT DETECTED
POC Cocaine UR: NOT DETECTED
POC Marijuana UR: NOT DETECTED
POC Methadone UR: NOT DETECTED
POC Methamphetamine UR: NOT DETECTED
POC Morphine: NOT DETECTED
POC Oxazepam (BZO): NOT DETECTED
POC Oxycodone UR: NOT DETECTED
POC Secobarbital (BAR): NOT DETECTED

## 2021-06-25 LAB — LIPID PANEL
Cholesterol: 181 mg/dL (ref 0–200)
HDL: 80 mg/dL (ref >40)
LDL Cholesterol: 74 mg/dL (ref 0–99)
Total CHOL/HDL Ratio: 2.3 RATIO
Triglycerides: 135 mg/dL (ref <150)
VLDL: 27 mg/dL (ref 0–40)

## 2021-06-25 LAB — URINALYSIS, ROUTINE W REFLEX MICROSCOPIC
Bilirubin Urine: NEGATIVE
Glucose, UA: NEGATIVE mg/dL
Hgb urine dipstick: NEGATIVE
Ketones, ur: NEGATIVE mg/dL
Leukocytes,Ua: NEGATIVE
Nitrite: NEGATIVE
Protein, ur: NEGATIVE mg/dL
Specific Gravity, Urine: 1.025 (ref 1.005–1.030)
pH: 6 (ref 5.0–8.0)

## 2021-06-25 LAB — COMPREHENSIVE METABOLIC PANEL
ALT: 20 U/L (ref 0–44)
AST: 34 U/L (ref 15–41)
Albumin: 4.1 g/dL (ref 3.5–5.0)
Alkaline Phosphatase: 84 U/L (ref 38–126)
Anion gap: 10 (ref 5–15)
BUN: 16 mg/dL (ref 8–23)
CO2: 22 mmol/L (ref 22–32)
Calcium: 9.4 mg/dL (ref 8.9–10.3)
Chloride: 111 mmol/L (ref 98–111)
Creatinine, Ser: 1.06 mg/dL — ABNORMAL HIGH (ref 0.44–1.00)
GFR, Estimated: 60 mL/min — ABNORMAL LOW (ref >60)
Glucose, Bld: 75 mg/dL (ref 70–99)
Potassium: 3.9 mmol/L (ref 3.5–5.1)
Sodium: 143 mmol/L (ref 135–145)
Total Bilirubin: 0.5 mg/dL (ref 0.3–1.2)
Total Protein: 7.4 g/dL (ref 6.5–8.1)

## 2021-06-25 LAB — HEMOGLOBIN A1C
Hgb A1c MFr Bld: 4.8 % (ref 4.8–5.6)
Mean Plasma Glucose: 91.06 mg/dL

## 2021-06-25 LAB — TSH: TSH: 1.005 u[IU]/mL (ref 0.350–4.500)

## 2021-06-25 LAB — POC SARS CORONAVIRUS 2 AG -  ED: SARS Coronavirus 2 Ag: NEGATIVE

## 2021-06-25 LAB — HEPATITIS PANEL, ACUTE
HCV Ab: NONREACTIVE
Hep A IgM: NONREACTIVE
Hep B C IgM: NONREACTIVE
Hepatitis B Surface Ag: NONREACTIVE

## 2021-06-25 LAB — MAGNESIUM: Magnesium: 2.2 mg/dL (ref 1.7–2.4)

## 2021-06-25 MED ORDER — NICOTINE 14 MG/24HR TD PT24
14.0000 mg | MEDICATED_PATCH | Freq: Every day | TRANSDERMAL | Status: DC
  Administered 2021-06-25 – 2021-06-26 (×2): 14 mg via TRANSDERMAL
  Filled 2021-06-25 (×2): qty 1

## 2021-06-25 MED ORDER — LOPERAMIDE HCL 2 MG PO CAPS: 2.0000 mg | ORAL_CAPSULE | ORAL | Status: AC | PRN

## 2021-06-25 MED ORDER — TRAZODONE HCL 50 MG PO TABS: 50.0000 mg | ORAL_TABLET | Freq: Every evening | ORAL | Status: DC | PRN

## 2021-06-25 MED ORDER — HYDROXYZINE HCL 25 MG PO TABS
25.0000 mg | ORAL_TABLET | Freq: Four times a day (QID) | ORAL | Status: AC | PRN
  Administered 2021-06-25: 25 mg via ORAL
  Filled 2021-06-25: qty 1

## 2021-06-25 MED ORDER — AMLODIPINE BESYLATE 10 MG PO TABS
10.0000 mg | ORAL_TABLET | Freq: Every day | ORAL | Status: DC
  Administered 2021-06-26 – 2021-06-29 (×4): 10 mg via ORAL
  Filled 2021-06-25 (×4): qty 1
  Filled 2021-06-25: qty 7

## 2021-06-25 MED ORDER — MAGNESIUM HYDROXIDE 400 MG/5ML PO SUSP: 30.0000 mL | Freq: Every day | ORAL | Status: DC | PRN

## 2021-06-25 MED ORDER — THIAMINE HCL 100 MG PO TABS
100.0000 mg | ORAL_TABLET | Freq: Every day | ORAL | Status: DC
  Administered 2021-06-26 – 2021-06-29 (×4): 100 mg via ORAL
  Filled 2021-06-25 (×3): qty 1
  Filled 2021-06-25: qty 7
  Filled 2021-06-25: qty 1

## 2021-06-25 MED ORDER — LORAZEPAM 1 MG PO TABS: 1.0000 mg | ORAL_TABLET | Freq: Four times a day (QID) | ORAL | Status: AC | PRN

## 2021-06-25 MED ORDER — QUETIAPINE FUMARATE 50 MG PO TABS
50.0000 mg | ORAL_TABLET | Freq: Two times a day (BID) | ORAL | Status: DC
  Administered 2021-06-25 – 2021-06-26 (×3): 50 mg via ORAL
  Filled 2021-06-25 (×3): qty 1

## 2021-06-25 MED ORDER — ACETAMINOPHEN 325 MG PO TABS
650.0000 mg | ORAL_TABLET | Freq: Four times a day (QID) | ORAL | Status: DC | PRN
  Administered 2021-06-26 – 2021-06-29 (×5): 650 mg via ORAL
  Filled 2021-06-25 (×5): qty 2

## 2021-06-25 MED ORDER — ONDANSETRON 4 MG PO TBDP: 4.0000 mg | ORAL_TABLET | Freq: Four times a day (QID) | ORAL | Status: AC | PRN

## 2021-06-25 MED ORDER — ALUM & MAG HYDROXIDE-SIMETH 200-200-20 MG/5ML PO SUSP: 30.0000 mL | ORAL | Status: DC | PRN

## 2021-06-25 NOTE — ED Notes (Signed)
Patient had her dinner. Patient is in bed sleeping. No signs of respiratory distress. Safety maintained.
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
## 2021-06-25 NOTE — ED Notes (Signed)
STAT labs collected and courier service contacted to take labs to [facility] lab.

## 2021-06-25 NOTE — Clinical Social Work Psych Note (Addendum)
CSW Update   CSW attempted to speak with patient. Patient was asleep in her room and did not respond to any attempts to wake her up.   CSW was informed from RN that the patient was concerned about her bed at [facility].    CSW called [employer] and was transferred to [employer]. CSW left a HIPAA compliant voicemail requesting a call back with the requested information.   CSW will continue to follow.    Baldo Daub, MSW, LCSW Clinical Child psychotherapist (Facility Based Crisis) [facility]

## 2021-06-25 NOTE — ED Notes (Signed)
Pt BP noted as 171/108.Pt stated she is suppose to take BP medication but is not currently taking it.  Pt also state she eats salty foods.  Will continue to monitor for safety.
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
## 2021-06-25 NOTE — ED Notes (Signed)
Pt currently in the OBS unit until two hour covid test results.  All labs have been collected at this time.  Nicotine patch placed on upper left shoulder.  Pt currently sitting in chair watching tv and eating  Will continue to monitor for safety.

## 2021-06-25 NOTE — ED Notes (Signed)
60yo female admitted to [facility] endorsing SI with a plan to jump in front of a train (no intent), VH pink rhinos and ETOH abuse. Patient is flat, anxious, sad and reserved but cooperative throughout assessment. Patient report being homeless X 59months. Patient verbally contract for safety on the unit. Will monitor for safety.

## 2021-06-25 NOTE — ED Notes (Signed)
Pt given dinner  

## 2021-06-25 NOTE — ED Notes (Signed)
Pt oriented to unit and given food and fluids.

## 2021-06-25 NOTE — ED Provider Notes (Addendum)
Behavioral Health Admission H&P [facility] & OBS)  Date: 06/25/21 Patient Name: Philippa Vessey MRN: [id number] Chief Complaint: No chief complaint on file.   Diagnoses:  Final diagnoses:  Bipolar I disorder, most recent episode depressed (HCC)    HPI: patient presented to [facility] as a walk in alone with complaints of "I need help for my depression and I think my medications are not right".  Selinda Eon, 60 y.o., female patient seen face to face by this provider, consulted with Dr. Lucianne Muss; and chart reviewed on 06/25/21.  On evaluation Ashla Murph reports she has lived in a shelter for the past 3 months.  States her time at the [employer] is running out.  States this has increased her stress and depression and has caused her to become suicidal.  Reports she stopped taking all of her psychotropic medications, she does not remember how long it has been.  Patient is unable to recall who her provider is.  States that she currently takes Seroquel, Risperdal, Saphris, Latuda, and valsartan.  She does not appear to be a good historian when recalling her medications and dosages.  During evaluation Lavergne Hiltunen is in sitting position in no acute distress.  She makes fair eye contact.  She is tearful at times.  She is disheveled.  Her speech is clear, coherent, and increased but not pressured.  She is alert, oriented x 4 and cooperative.  She is anxious.  Reports her mood is depressed with a congruent affect.  Reports feeling hopeless and worthless. Reports she only sleeps 3 hours per night. Reports decreased appetite.  Objectively there is no evidence of psychosis/mania or delusional thinking.  She does not appear to be responding to internal/external stimuli.  Endorses suicidal ideations with a plan to stand in front of a [employer] train.  States, "it would be the quickest least painful way".  Denies intent, states she does not think she would do it. Patient can not contract for safety at this time.  Reports recent loss of her pet fish "Fred". States she found much comfort in [place] and loosing him was devastating.  Denies homicidal ideation.  Endorses visual hallucinations when she is stressed and only when she is at the shelter.  States she sees "pink rhinos" when she does hallucinate. States she does feel paranoid, but only at the shelter. Patient is able to converse coherently, goal directed thoughts, no distractibility, or pre-occupation.   Discussed admission to the [facility] patient agrees.    PHQ 2-9:     Total Time spent with patient: 45 minutes  Musculoskeletal  Strength & Muscle Tone: within normal limits Gait & Station: normal Patient leans: N/A  Psychiatric Specialty Exam  Presentation General Appearance: Disheveled  Eye Contact:Fair  Speech:Clear and Coherent; Normal Rate  Speech Volume:Normal  Handedness:Right   Mood and Affect  Mood:Anxious; Depressed; Worthless; Hopeless  Affect:Congruent; Tearful   Thought Process  Thought Processes:Coherent  Descriptions of Associations:Intact  Orientation:None  Thought Content:Logical; Scattered    Hallucinations:Hallucinations: Visual Description of Visual Hallucinations: at times when she is stressed and only inthe shelter she sees pink rhinos  Ideas of Reference:Paranoia (paranoid only in the shelter)  Suicidal Thoughts:Suicidal Thoughts: Yes, Passive SI Passive Intent and/or Plan: With Plan; Without Access to Means; Without Intent  Homicidal Thoughts:Homicidal Thoughts: No   Sensorium  Memory:Immediate Good; Recent Good; Remote Good  Judgment:Poor  Insight:Poor   Executive Functions  Concentration:Good  Attention Span:Good  Recall:Good  Fund of Knowledge:Good  Language:Good   Psychomotor  Activity  Psychomotor Activity:Psychomotor Activity: Normal   Assets  Assets:Communication Skills; Desire for Improvement; Financial Resources/Insurance; Leisure Time; Physical Health;  Resilience   Sleep  Sleep:Sleep: Poor Number of Hours of Sleep: 3   Nutritional Assessment (For OBS and FBC admissions only) Has the patient had a weight loss or gain of 10 pounds or more in the last 3 months?: Yes Has the patient had a decrease in food intake/or appetite?: Yes Does the patient have dental problems?: No Does the patient have eating habits or behaviors that may be indicators of an eating disorder including binging or inducing vomiting?: No Has the patient recently lost weight without trying?: 0 Has the patient been eating poorly because of a decreased appetite?: 1 Malnutrition Screening Tool Score: 1   Physical Exam Vitals and nursing note reviewed.  Constitutional:      General: She is not in acute distress.    Appearance: Normal appearance. She is not ill-appearing.  HENT:     Head: Normocephalic.  Eyes:     General:        Right eye: No discharge.        Left eye: No discharge.     Conjunctiva/sclera: Conjunctivae normal.  Cardiovascular:     Rate and Rhythm: Normal rate.  Pulmonary:     Effort: Pulmonary effort is normal. No respiratory distress.  Musculoskeletal:        General: Normal range of motion.     Cervical back: Normal range of motion.  Skin:    Coloration: Skin is not jaundiced or pale.  Neurological:     Mental Status: She is alert and oriented to person, place, and time.  Psychiatric:        Attention and Perception: Attention normal. She perceives visual hallucinations.        Mood and Affect: Mood is anxious and depressed. Affect is tearful.        Speech: Speech normal.        Thought Content: Thought content is paranoid. Thought content includes suicidal ideation. Thought content includes suicidal plan.        Cognition and Memory: Cognition normal.        Judgment: Judgment is impulsive.   Review of Systems  Constitutional:  Negative for chills and fever.  HENT: Negative.    Eyes: Negative.   Respiratory: Negative.  Negative  for cough.   Cardiovascular: Negative.  Negative for chest pain.  Musculoskeletal: Negative.   Skin: Negative.   Neurological: Negative.   Psychiatric/Behavioral:  Positive for depression, hallucinations and suicidal ideas. The patient is nervous/anxious.    Blood pressure (!) 150/90, pulse 83, temperature 98.4 F (36.9 C), temperature source Oral, resp. rate 16, SpO2 99 %. There is no height or weight on file to calculate BMI.  Past Psychiatric History: Bipolar disorder, GAD, depression, substance abuse  Is the patient at risk to self? Yes  Has the patient been a risk to self in the past 6 months? No .    Has the patient been a risk to self within the distant past? No   Is the patient a risk to others? No   Has the patient been a risk to others in the past 6 months? No   Has the patient been a risk to others within the distant past? No   Past Medical History:  Past Medical History:  Diagnosis Date   Anxiety    Bipolar disorder (HCC)    Depression    ETOH abuse  Hypertension    Joint pain    hands    Past Surgical History:  Procedure Laterality Date   CESAREAN SECTION     MULTIPLE EXTRACTIONS WITH ALVEOLOPLASTY Bilateral 12/20/2016   Procedure: BILATERAL TORI;  Surgeon: Ocie Doyne, DDS;  Location: [facility] OR;  Service: Oral Surgery;  Laterality: Bilateral;    Family History:  Family History  Adopted: Yes    Social History:  Social History   Socioeconomic History   Marital status: Single    Spouse name: Not on file   Number of children: Not on file   Years of education: Not on file   Highest education level: Not on file  Occupational History   Not on file  Tobacco Use   Smoking status: Every Day    Packs/day: 0.50    Years: 35.00    Pack years: 17.50    Types: Cigarettes   Smokeless tobacco: Never  Substance and Sexual Activity   Alcohol use: Yes    Alcohol/week: 12.0 standard drinks    Types: 12 Glasses of wine per week   Drug use: No   Sexual activity:  Not on file  Other Topics Concern   Not on file  Social History Narrative   Not on file   Social Determinants of Health   Financial Resource Strain: Not on file  Food Insecurity: Not on file  Transportation Needs: Not on file  Physical Activity: Not on file  Stress: Not on file  Social Connections: Not on file  Intimate Partner Violence: Not on file    SDOH:  SDOH Screenings   Alcohol Screen: Not on file  Depression (PHQ2-9): Not on file  Financial Resource Strain: Not on file  Food Insecurity: Not on file  Housing: Not on file  Physical Activity: Not on file  Social Connections: Not on file  Stress: Not on file  Tobacco Use: High Risk   Smoking Tobacco Use: Every Day   Smokeless Tobacco Use: Never  Transportation Needs: Not on file    Last Labs:  No visits with results within 6 Month(s) from this visit.  Latest known visit with results is:  Admission on 03/29/2018, Discharged on 03/29/2018  Component Date Value Ref Range Status   Color, Urine 03/29/2018 STRAW (A) YELLOW Final   APPearance 03/29/2018 CLEAR  CLEAR Final   Specific Gravity, Urine 03/29/2018 1.003 (A) 1.005 - 1.030 Final   pH 03/29/2018 5.0  5.0 - 8.0 Final   Glucose, UA 03/29/2018 NEGATIVE  NEGATIVE mg/dL Final   Hgb urine dipstick 03/29/2018 MODERATE (A) NEGATIVE Final   Bilirubin Urine 03/29/2018 NEGATIVE  NEGATIVE Final   Ketones, ur 03/29/2018 NEGATIVE  NEGATIVE mg/dL Final   Protein, ur 08/67/6195 NEGATIVE  NEGATIVE mg/dL Final   Nitrite 09/32/6712 NEGATIVE  NEGATIVE Final   Leukocytes, UA 03/29/2018 SMALL (A) NEGATIVE Final   RBC / HPF 03/29/2018 0-5  0 - 5 RBC/hpf Final   WBC, UA 03/29/2018 0-5  0 - 5 WBC/hpf Final   Bacteria, UA 03/29/2018 RARE (A) NONE SEEN Final   Squamous Epithelial / LPF 03/29/2018 0-5  0 - 5 Final   Performed at [facility], [address], [place], [place] [address]   Specimen Description 03/29/2018    Final                   Value:URINE,  RANDOM Performed at [facility], [address], [place], [place] [address]    Special Requests 03/29/2018  Final                   Value:NONE Performed at [facility], [address] [place], [place] [address]    Culture 03/29/2018    Final                   Value:NO GROWTH Performed at [facility], [address] [place], [place] [address]    Report Status 03/29/2018 03/31/2018 FINAL   Final    Allergies: Penicillins and Niacin and related  PTA Medications: (Not in a hospital admission)   Medical Decision Making  Patient presents with SI with a plan. She can not contract for safety. [facility].     Recommendations  Based on my evaluation the patient does not appear to have an emergency medical condition.  [facility].  Labwork ordered:  CBC w diff, CMP, TSH, Lipid, Hep panel, Chalmydia, HIV, A1C, ethanol, U/A, UDS   EKG ordered.   Seroquel 50 mg BID PO started Home med Amlodipine 10 mg restarted.   Ardis Hughsarolyn H Aubrynn Katona, NP 06/25/21  11:00 AM

## 2021-06-25 NOTE — ED Provider Notes (Signed)
[facility] Admission Suicide Risk Assessment   Nursing information obtained from:   Chart Demographic factors:   african [place] female, low socioeconomic status, Current Mental Status:   See subjective data Loss Factors:   Low social economic status, lives in shelter homelessness,  Historical Factors:   impulsivity, hx of substance abuse, depression  Risk Reduction Factors:   obligation to family, forward thinking  Total Time spent with patient: 45 minutes Principal Problem: <principal problem not specified> Diagnosis:  Active Problems:   Bipolar 1 disorder, depressed, severe (HCC)  Subjective Data: Debbie Cox, 60 y.o., female patient seen face to face by this provider, consulted with Dr. Lucianne Muss; and chart reviewed on 06/25/21.  On evaluation Debbie Cox reports she has lived in a shelter for the past 3 months.  States her time at the [employer] is running out.  States this has increased her stress and depression and has caused her to become suicidal.  Reports she stopped taking all of her psychotropic medications, she does not remember how long it has been.  Patient is unable to recall who her provider is.  States that she currently takes Seroquel, Risperdal, Saphris, Latuda, and valsartan.  She does not appear to be a good historian when recalling her medications and dosages.   During evaluation Enis Leatherwood is in sitting position in no acute distress.  She makes fair eye contact.  She is tearful at times.  She is disheveled.  Her speech is clear, coherent, and increased but not pressured.  She is alert, oriented x 4 and cooperative.  She is anxious.  Reports her mood is depressed with a congruent affect.  Reports feeling hopeless and worthless. Reports she only sleeps 3 hours per night. Reports decreased appetite.  Objectively there is no evidence of psychosis/mania or delusional thinking.  She does not appear to be responding to internal/external stimuli.  Endorses suicidal ideations with  a plan to stand in front of a [employer] train.  States, "it would be the quickest least painful way".  Denies intent, states she does not think she would do it. Patient can not contract for safety at this time.  Denies homicidal ideation.  Endorses visual hallucinations when she is stressed and only when she is at the shelter.  States she sees "pink rhinos" when she does hallucinate. States she does feel paranoid, but only at the shelter. Patient is able to converse coherently, goal directed thoughts, no distractibility, or pre-occupation.    Discussed admission to the [facility] patient agrees.    Continued Clinical Symptoms:    The "Alcohol Use Disorders Identification Test", Guidelines for Use in Primary Care, Second Edition.  World Science writer [facility]). Score between 0-7:  no or low risk or alcohol related problems. Score between 8-15:  moderate risk of alcohol related problems. Score between 16-19:  high risk of alcohol related problems. Score 20 or above:  warrants further diagnostic evaluation for alcohol dependence and treatment.   CLINICAL FACTORS:   Severe Anxiety and/or Agitation Bipolar Disorder:   Depressive phase Depression:   Comorbid alcohol abuse/dependence Hopelessness Impulsivity Alcohol/Substance Abuse/Dependencies   Musculoskeletal: Strength & Muscle Tone: within normal limits Gait & Station: normal Patient leans: N/A  Psychiatric Specialty Exam:  Presentation  General Appearance: Disheveled  Eye Contact:Fair  Speech:Clear and Coherent; Normal Rate  Speech Volume:Normal  Handedness:Right   Mood and Affect  Mood:Anxious; Depressed; Worthless; Hopeless  Affect:Congruent; Tearful   Thought Process  Thought Processes:Coherent  Descriptions of Associations:Intact  Orientation:None  Thought  Content:Logical; Scattered  History of Schizophrenia/Schizoaffective disorder:No  Duration of Psychotic Symptoms:Less than six  months  Hallucinations:Hallucinations: Visual Description of Visual Hallucinations: at times when she is stressed and only inthe shelter she sees pink rhinos  Ideas of Reference:Paranoia (paranoid only in the shelter)  Suicidal Thoughts:Suicidal Thoughts: Yes, Passive SI Passive Intent and/or Plan: With Plan; Without Access to Means; Without Intent  Homicidal Thoughts:Homicidal Thoughts: No   Sensorium  Memory:Immediate Good; Recent Good; Remote Good  Judgment:Poor  Insight:Poor   Executive Functions  Concentration:Good  Attention Span:Good  Recall:Good  Fund of Knowledge:Good  Language:Good   Psychomotor Activity  Psychomotor Activity:Psychomotor Activity: Normal   Assets  Assets:Communication Skills; Desire for Improvement; Financial Resources/Insurance; Leisure Time; Physical Health; Resilience   Sleep  Sleep:Sleep: Poor Number of Hours of Sleep: 3    Physical Exam: Physical Exam see h&p ROSsee h&p Blood pressure 140/81, pulse 60, temperature 98.4 F (36.9 C), temperature source Oral, resp. rate 16, SpO2 99 %. There is no height or weight on file to calculate BMI.   COGNITIVE FEATURES THAT CONTRIBUTE TO RISK:  None    SUICIDE RISK:  Moderate:  Frequent suicidal ideation with limited intensity, and duration, some specificity in terms of plans, no associated intent, good self-control, limited dysphoria/symptomatology, some risk factors present, and identifiable protective factors, including available and accessible social support.  PLAN OF CARE:  Based on my evaluation the patient does not appear to have an emergency medical condition.   Patient meets criteria for treatment in the [facility].   Labwork ordered:  CBC w diff, CMP, TSH, Lipid, Hep panel, Chalmydia, HIV, A1C, ethanol, U/A, UDS    EKG ordered.    Seroquel 50 mg BID PO started Home med Amlodipine 10 mg restarted.   I certify that inpatient services furnished can reasonably be expected to  improve the patient's condition.   Ardis Hughs, NP 06/25/2021, 3:34 PM

## 2021-06-25 NOTE — ED Notes (Signed)
Patient is in the bed sleeping. No signs of distress noted. Will continue to monitor for safety.

## 2021-06-25 NOTE — ED Notes (Signed)
Patient seclusive to room. Denis HI, endorses passive SI, Endorses audio hallucinations - will continue to monitor for safety

## 2021-06-26 DIAGNOSIS — F333 Major depressive disorder, recurrent, severe with psychotic symptoms: Secondary | ICD-10-CM | POA: Diagnosis not present

## 2021-06-26 DIAGNOSIS — F4310 Post-traumatic stress disorder, unspecified: Secondary | ICD-10-CM | POA: Diagnosis not present

## 2021-06-26 DIAGNOSIS — R45851 Suicidal ideations: Secondary | ICD-10-CM | POA: Diagnosis not present

## 2021-06-26 DIAGNOSIS — F1421 Cocaine dependence, in remission: Secondary | ICD-10-CM | POA: Diagnosis not present

## 2021-06-26 LAB — GC/CHLAMYDIA PROBE AMP (~~LOC~~) NOT AT ARMC
Chlamydia: NEGATIVE
Comment: NEGATIVE
Comment: NORMAL
Neisseria Gonorrhea: NEGATIVE

## 2021-06-26 MED ORDER — NICOTINE 14 MG/24HR TD PT24
14.0000 mg | MEDICATED_PATCH | Freq: Every day | TRANSDERMAL | Status: DC
  Administered 2021-06-27 – 2021-06-29 (×2): 14 mg via TRANSDERMAL
  Filled 2021-06-26 (×3): qty 1
  Filled 2021-06-26: qty 7

## 2021-06-26 MED ORDER — QUETIAPINE FUMARATE 100 MG PO TABS
100.0000 mg | ORAL_TABLET | Freq: Every day | ORAL | Status: DC
  Administered 2021-06-27 – 2021-06-28 (×2): 100 mg via ORAL
  Filled 2021-06-26: qty 7
  Filled 2021-06-26 (×2): qty 1

## 2021-06-26 MED ORDER — QUETIAPINE FUMARATE 50 MG PO TABS
75.0000 mg | ORAL_TABLET | Freq: Every day | ORAL | Status: AC
  Administered 2021-06-26: 75 mg via ORAL
  Filled 2021-06-26: qty 1

## 2021-06-26 NOTE — ED Notes (Signed)
Pt resting in bed. A&O x4, calm and cooperative. Pt denies current SI/HI/AVH. Pt reports aching back pain 8/10 and is requesting pain medication. No signs of acute distress noted. Will continue to monitor for safety.

## 2021-06-26 NOTE — ED Notes (Signed)
Pt had lunch 

## 2021-06-26 NOTE — Group Note (Signed)
Group Topic: Overcoming Obstacles  Group Date: 06/26/2021 Start Time: 1000 End Time: 1030 Facilitators: Liam Rogers  Department: [facility]  Number of Participants: 4  Group Focus: goals/reality orientation Treatment Modality:  Skills Training Interventions utilized were group exercise Purpose: improve communication skills   Today on 06/26/21 pts were instructed to talk about goals their personal individual goals that they had for themselves. Each pt revealed each goal that they had for the day or week and also revealed some barriers that would stop them frrom completing their goals. Also we talked in the group what person, place, or thing that they could reach out to if they needed help with their goals.   Name: Debbie Cox Date of Birth: 18-Aug-1961  MR: [id number]    Level of Participation: active Quality of Participation: engaged Interactions with others: gave feedback Mood/Affect: brightens with interaction Triggers (if applicable): N/A Cognition: goal directed Progress: Moderate Response: N/A Plan: referral / recommendations  Patients Problems:  Patient Active Problem List   Diagnosis Date Noted   Bipolar 1 disorder, depressed, severe (HCC) 06/25/2021

## 2021-06-26 NOTE — ED Notes (Signed)
Patient resting well. No sxs of distress, will continue to monitor for safety
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
## 2021-06-26 NOTE — Clinical Social Work Psych Note (Signed)
Emotional Reg & Skills  Emotional Regulation & Emotional Regulation Skills  Date: 06/26/21  Type of Therapy/Therapeutic Modalities: Processing Group, Motivational Interviewing, Socratic Questioning, Psycho-Education, CBT  Participation Level: Did Not Attend  Objective: The purpose of this group is to discuss with patients the importance of emotional regulations skills in every day life and how those skills assist them in maintaining or achieving overall well-being in all dimensions of life.  Therapeutic Goals:  Patient will identify emotions they struggle managing and will reflect on the triggering factors and the behavioral responses associated with them.  Patient will review emotional regulation techniques that will enhance their ability to manage their emotions more appropriately.  Patient will discuss with group members their findings and how they plan to implement emotional regulation techniques in their road to recovery.   Summary of Patient's Progress:   Patient did not attend group session.

## 2021-06-26 NOTE — Clinical Social Work Psych Note (Signed)
CSW Update   CSW met with patient to determine if there were any updates or additional needs identified.   Debbie Cox was pleasant and cooperative, however appeared to be exteremly lethargic and could barely stay awake to speak with this Probation officer.   Debbie Cox shared that she has not "been feeling like herself". She shared that she believes she began to decompensate after she had to leave her apartment due to its conditions. She reports she has a Printmaker, however has not been able to find new housing.   Debbie Cox reports having some issues at the [facility] prior to her admission at the [facility]. She mentioned "I was acting different cause I wasn't on my meds. I don't normally act that way. My friends were even concerned".   As CSW asked more questions regarding her issues at the shelter, Debbie Cox presented with tangential speech regarding her two adult children, previous substance use and other unknown topics.   CSW inquired about Debbie Cox's substance use, however she denied that it was a current issue and denied any referrals or resources at this time.   Debbie Cox shared that she was "very tired and just need to rest". She also mentioned that she wants to be stabilized on her medications prior to being discharged. She continues to worry about her bed placement at [facility].   CSW attempted to follow up yesterday, however there was no answer. CSW left a HIPAA compliant voicemail requesting a call back.    CSW will continue to follow.    Debbie Cox, MSW, LCSW Clinical Education officer, museum ([place]) [facility]

## 2021-06-26 NOTE — ED Notes (Signed)
Patient is in the bed sleeping. No signs of respiratory distress noted. Will continue to monitor for safety. 

## 2021-06-26 NOTE — ED Notes (Signed)
Snack given with milk

## 2021-06-26 NOTE — ED Notes (Signed)
Patient continues to rest with no distress noted - will continue to monitor for safety °

## 2021-06-26 NOTE — ED Notes (Signed)
Patient is in the room sleeping. Cooperative with staff and denied SI,HI,AVH. Patient safety maintained.

## 2021-06-26 NOTE — ED Notes (Signed)
Patient received her morning medication after her breakfast. Denies SI,HI,AVH. Patient is cooperative and interacts well with saff. No distress or discomfort noted. Will continue to monitor for safety.

## 2021-06-26 NOTE — ED Notes (Signed)
Pt had meal 

## 2021-06-26 NOTE — ED Notes (Signed)
Patient states she is "a little anxious" when offered her medication, she said she wants to talk to provider so "we can get it all right,"  will continue to monitor for safety

## 2021-06-26 NOTE — ED Notes (Signed)
Pt had breakfast. 

## 2021-06-27 DIAGNOSIS — R45851 Suicidal ideations: Secondary | ICD-10-CM | POA: Diagnosis not present

## 2021-06-27 DIAGNOSIS — F1421 Cocaine dependence, in remission: Secondary | ICD-10-CM

## 2021-06-27 DIAGNOSIS — B9689 Other specified bacterial agents as the cause of diseases classified elsewhere: Secondary | ICD-10-CM

## 2021-06-27 DIAGNOSIS — F4310 Post-traumatic stress disorder, unspecified: Secondary | ICD-10-CM

## 2021-06-27 DIAGNOSIS — F333 Major depressive disorder, recurrent, severe with psychotic symptoms: Secondary | ICD-10-CM | POA: Diagnosis not present

## 2021-06-27 DIAGNOSIS — F1020 Alcohol dependence, uncomplicated: Secondary | ICD-10-CM

## 2021-06-27 DIAGNOSIS — N760 Acute vaginitis: Secondary | ICD-10-CM

## 2021-06-27 MED ORDER — DICLOFENAC SODIUM 1 % EX GEL
1.0000 "application " | Freq: Two times a day (BID) | CUTANEOUS | Status: DC | PRN
  Administered 2021-06-29: 1 via TOPICAL
  Filled 2021-06-27: qty 100

## 2021-06-27 MED ORDER — METRONIDAZOLE 250 MG PO TABS
500.0000 mg | ORAL_TABLET | Freq: Two times a day (BID) | ORAL | Status: DC
Start: 2021-06-27 — End: 2021-06-29
  Administered 2021-06-27 – 2021-06-29 (×4): 500 mg via ORAL
  Filled 2021-06-27 (×2): qty 2
  Filled 2021-06-27: qty 20
  Filled 2021-06-27 (×2): qty 2

## 2021-06-27 NOTE — ED Notes (Signed)
Breakfast given: biscuit, stewed apples, maple & brown sugar oatmeal and milk

## 2021-06-27 NOTE — Progress Notes (Signed)
I attest that I listened to the student's presentation and reviewed and edited the student note. I further attest that the components of the history of the present illness, the physical exam, and the assessment and plan documented were performed by the student in the  presence of me (teaching physician). I verified the documentation, personally performed the examination and I also verified the medical decision making.   Debbie Saxon, MD  Behavioral Health Progress Note  Date and Time: 06/27/2021 11:31 PM Name: Debbie Cox MRN:  [id number]  Subjective:  Patient seen and chart reviewed. She denies SI, HI, AH. Last night, she had a nightmare and saw pink rhinos in her peripheral vision when waking up. She says that the Seroquel "levels her out" and appreciates the change to taking it only at night. She says that her mood has been "kinda swaying" and mentions that she often feels irritable when around people. She says that she was angry when she was woken up this morning. She has been reflecting about standing on the train tracks prior to admission and is trying to figure out why she felt the way she did at the time. She says that as she has gotten older, she spends more time thinking about some of her past traumas.   She says that she is "not herself anymore" and that this change occurred roughly 2-3 years ago. She says that one of her friends also noticed this change, specifically regarding her memory. Bernece says that her short-term memory has been poor, with particular difficulty in remembering directions and street names. She also says that she has had difficulty thinking.   When asked about previous episodes where she didn't sleep much, she says that she was very energetic and "cleaned my house for days. I was so detailed that I even got a toothbrush out to clean the corners." She says that every time she slept, it felt like a full night of sleep, but after looking at the clock realized  that it had been 10 minutes. She says that during this time, she was using powder cocaine with friends. She says that she was living by herself in an apartment at the time, so she was able to manage herself. She denies being hospitalized during this time. She says that after this time, she "crashed hard".  She says that she has had pain across both shoulders and down her back "in a T pattern". She also reports her urine "smelling bad" and vaginal discharge. When she talks about her concern with her current bacterial infection, she jumps to describing a dental infection that she had in 2003 that was "6 hours away from killing her".  She is concerned with keeping track of her medications. Throughout the interview, she lists all of the medications that she is currently taking and describes when she takes them.   I (Dr. Gasper Sells) saw patient later in the day - she gave me a thumbs up and said that "whatever you got me on is doing the job"  Diagnosis:  Final diagnoses:  Bipolar I disorder, most recent episode depressed (HCC)    Total Time spent with patient: 30 minutes  Past Psychiatric History: See H&P Past Medical History:  Past Medical History:  Diagnosis Date   Anxiety    Bipolar disorder (HCC)    Depression    ETOH abuse    Hypertension    Joint pain    hands    Past Surgical History:  Procedure Laterality Date  CESAREAN SECTION     MULTIPLE EXTRACTIONS WITH ALVEOLOPLASTY Bilateral 12/20/2016   Procedure: BILATERAL TORI;  Surgeon: Ocie Doyne, DDS;  Location: [facility] OR;  Service: Oral Surgery;  Laterality: Bilateral;   Family History:  Family History  Adopted: Yes   Family Psychiatric  History: See H&P Social History:  Social History   Substance and Sexual Activity  Alcohol Use Yes   Alcohol/week: 12.0 standard drinks   Types: 12 Glasses of wine per week     Social History   Substance and Sexual Activity  Drug Use No    Social History   Socioeconomic History    Marital status: Single    Spouse name: Not on file   Number of children: Not on file   Years of education: Not on file   Highest education level: Not on file  Occupational History   Not on file  Tobacco Use   Smoking status: Every Day    Packs/day: 0.50    Years: 35.00    Pack years: 17.50    Types: Cigarettes   Smokeless tobacco: Never  Substance and Sexual Activity   Alcohol use: Yes    Alcohol/week: 12.0 standard drinks    Types: 12 Glasses of wine per week   Drug use: No   Sexual activity: Not on file  Other Topics Concern   Not on file  Social History Narrative   Not on file   Social Determinants of Health   Financial Resource Strain: Not on file  Food Insecurity: Not on file  Transportation Needs: Not on file  Physical Activity: Not on file  Stress: Not on file  Social Connections: Not on file   SDOH:  SDOH Screenings   Alcohol Screen: Not on file  Depression (PHQ2-9): Medium Risk   PHQ-2 Score: 12  Financial Resource Strain: Not on file  Food Insecurity: Not on file  Housing: Not on file  Physical Activity: Not on file  Social Connections: Not on file  Stress: Not on file  Tobacco Use: High Risk   Smoking Tobacco Use: Every Day   Smokeless Tobacco Use: Never  Transportation Needs: Not on file   Additional Social History:    Pain Medications: see MAR Prescriptions: see MAR Over the Counter: see MAr History of alcohol / drug use?: Yes Longest period of sobriety (when/how long): unknown Name of Substance 1: alcohol 1 - Age of First Use: 13 1 - Amount (size/oz): 2 pints [place] 1 - Frequency: daily 1 - Duration: ongoing 1 - Last Use / Amount: yesterday 2 pints 1 - Method of Aquiring: purchase 1- Route of Use: drink Name of Substance 2: cocaine (powder) 2 - Age of First Use: unknown 2 - Amount (size/oz): varies 2 - Frequency: infrequently 2 - Duration: occasionally 2 - Last Use / Amount: on her birthday this year (Feb, 2023) 2 - Method of  Aquiring: friend 2 - Route of Substance Use: snort                Sleep: Fair  Appetite:  Good  Current Medications:  Current Facility-Administered Medications  Medication Dose Route Frequency Provider Last Rate Last Admin   acetaminophen (TYLENOL) tablet 650 mg  650 mg Oral Q6H PRN Ardis Hughs, NP   650 mg at 06/27/21 0900   alum & mag hydroxide-simeth (MAALOX/MYLANTA) 200-200-20 MG/5ML suspension 30 mL  30 mL Oral Q4H PRN Ardis Hughs, NP       amLODipine (NORVASC) tablet 10 mg  10  mg Oral Daily Ardis Hughs, NP   10 mg at 06/27/21 0840   hydrOXYzine (ATARAX/VISTARIL) tablet 25 mg  25 mg Oral Q6H PRN Ardis Hughs, NP   25 mg at 06/25/21 1553   loperamide (IMODIUM) capsule 2-4 mg  2-4 mg Oral PRN Ardis Hughs, NP       LORazepam (ATIVAN) tablet 1 mg  1 mg Oral Q6H PRN Ardis Hughs, NP       magnesium hydroxide (MILK OF MAGNESIA) suspension 30 mL  30 mL Oral Daily PRN Ardis Hughs, NP       metroNIDAZOLE (FLAGYL) tablet 500 mg  500 mg Oral BID Savier Trickett A       nicotine (NICODERM CQ - dosed in mg/24 hours) patch 14 mg  14 mg Transdermal Q0600 Mercedes Fort A   14 mg at 06/27/21 0554   ondansetron (ZOFRAN-ODT) disintegrating tablet 4 mg  4 mg Oral Q6H PRN Ardis Hughs, NP       QUEtiapine (SEROQUEL) tablet 100 mg  100 mg Oral QHS Margaretann Abate A   100 mg at 06/27/21 2123   thiamine tablet 100 mg  100 mg Oral Daily Ardis Hughs, NP   100 mg at 06/27/21 5956   traZODone (DESYREL) tablet 50 mg  50 mg Oral QHS PRN Ardis Hughs, NP       Current Outpatient Medications  Medication Sig Dispense Refill   amLODipine (NORVASC) 10 MG tablet Take 10 mg by mouth daily.     cetirizine (ZYRTEC) 10 MG tablet Take 10 mg by mouth daily.     lurasidone (LATUDA) 40 MG TABS tablet Take 1 tablet (40 mg total) by mouth daily with breakfast. 30 tablet 1   meloxicam (MOBIC) 7.5 MG tablet Take 7.5 mg by mouth daily.      metroNIDAZOLE (FLAGYL) 500 MG tablet Take 500 mg by mouth 2 (two) times daily.      Labs  Lab Results:  Admission on 06/25/2021  Component Date Value Ref Range Status   SARS Coronavirus 2 by RT PCR 06/25/2021 NEGATIVE  NEGATIVE Final   Comment: (NOTE) SARS-CoV-2 target nucleic acids are NOT DETECTED.  The SARS-CoV-2 RNA is generally detectable in upper respiratory specimens during the acute phase of infection. The lowest concentration of SARS-CoV-2 viral copies this assay can detect is 138 copies/mL. A negative result does not preclude SARS-Cov-2 infection and should not be used as the sole basis for treatment or other patient management decisions. A negative result may occur with  improper specimen collection/handling, submission of specimen other than nasopharyngeal swab, presence of viral mutation(s) within the areas targeted by this assay, and inadequate number of viral copies(<138 copies/mL). A negative result must be combined with clinical observations, patient history, and epidemiological information. The expected result is Negative.  Fact Sheet for Patients:  BloggerCourse.com  Fact Sheet for Healthcare Providers:  SeriousBroker.it  This test is no                          t yet approved or cleared by the [place] FDA and  has been authorized for detection and/or diagnosis of SARS-CoV-2 by FDA under an Emergency Use Authorization (EUA). This EUA will remain  in effect (meaning this test can be used) for the duration of the COVID-19 declaration under Section 564(b)(1) of the Act, 21 U.S.C.section 360bbb-3(b)(1), unless the authorization is terminated  or revoked sooner.  Influenza A by PCR 06/25/2021 NEGATIVE  NEGATIVE Final   Influenza B by PCR 06/25/2021 NEGATIVE  NEGATIVE Final   Comment: (NOTE) The Xpert Xpress SARS-CoV-2/FLU/RSV plus assay is intended as an aid in the diagnosis of influenza from  Nasopharyngeal swab specimens and should not be used as a sole basis for treatment. Nasal washings and aspirates are unacceptable for Xpert Xpress SARS-CoV-2/FLU/RSV testing.  Fact Sheet for Patients: BloggerCourse.com  Fact Sheet for Healthcare Providers: SeriousBroker.it  This test is not yet approved or cleared by the [place] FDA and has been authorized for detection and/or diagnosis of SARS-CoV-2 by FDA under an Emergency Use Authorization (EUA). This EUA will remain in effect (meaning this test can be used) for the duration of the COVID-19 declaration under Section 564(b)(1) of the Act, 21 U.S.C. section 360bbb-3(b)(1), unless the authorization is terminated or revoked.  Performed at [facility], [address], [place], [place] [address]    SARS Coronavirus 2 Ag 06/25/2021 Negative  Negative Final   WBC 06/25/2021 6.0  4.0 - 10.5 K/uL Final   RBC 06/25/2021 4.44  3.87 - 5.11 MIL/uL Final   Hemoglobin 06/25/2021 12.8  12.0 - 15.0 g/dL Final   HCT 60/45/4098 39.4  36.0 - 46.0 % Final   MCV 06/25/2021 88.7  80.0 - 100.0 fL Final   MCH 06/25/2021 28.8  26.0 - 34.0 pg Final   MCHC 06/25/2021 32.5  30.0 - 36.0 g/dL Final   RDW 11/91/4782 14.3  11.5 - 15.5 % Final   Platelets 06/25/2021 167  150 - 400 K/uL Final   nRBC 06/25/2021 0.0  0.0 - 0.2 % Final   Neutrophils Relative % 06/25/2021 66  % Final   Neutro Abs 06/25/2021 3.9  1.7 - 7.7 K/uL Final   Lymphocytes Relative 06/25/2021 24  % Final   Lymphs Abs 06/25/2021 1.4  0.7 - 4.0 K/uL Final   Monocytes Relative 06/25/2021 7  % Final   Monocytes Absolute 06/25/2021 0.4  0.1 - 1.0 K/uL Final   Eosinophils Relative 06/25/2021 2  % Final   Eosinophils Absolute 06/25/2021 0.1  0.0 - 0.5 K/uL Final   Basophils Relative 06/25/2021 1  % Final   Basophils Absolute 06/25/2021 0.0  0.0 - 0.1 K/uL Final   Immature Granulocytes 06/25/2021 0  % Final   Abs Immature  Granulocytes 06/25/2021 0.01  0.00 - 0.07 K/uL Final   Performed at [facility], [address] [place], [place] [address]   Sodium 06/25/2021 143  135 - 145 mmol/L Final   Potassium 06/25/2021 3.9  3.5 - 5.1 mmol/L Final   Chloride 06/25/2021 111  98 - 111 mmol/L Final   CO2 06/25/2021 22  22 - 32 mmol/L Final   Glucose, Bld 06/25/2021 75  70 - 99 mg/dL Final   Glucose reference range applies only to samples taken after fasting for at least 8 hours.   BUN 06/25/2021 16  8 - 23 mg/dL Final   Creatinine, Ser 06/25/2021 1.06 (A) 0.44 - 1.00 mg/dL Final   Calcium 30/86/5784 9.4  8.9 - 10.3 mg/dL Final   Total Protein 69/62/9528 7.4  6.5 - 8.1 g/dL Final   Albumin 41/32/4401 4.1  3.5 - 5.0 g/dL Final   AST 02/72/5366 34  15 - 41 U/L Final   ALT 06/25/2021 20  0 - 44 U/L Final   Alkaline Phosphatase 06/25/2021 84  38 - 126 U/L Final   Total Bilirubin 06/25/2021 0.5  0.3 -  1.2 mg/dL Final   GFR, Estimated 06/25/2021 60 (A) >60 mL/min Final   Comment: (NOTE) Calculated using the CKD-EPI Creatinine Equation (2021)    Anion gap 06/25/2021 10  5 - 15 Final   Performed at [facility], [address], [place], [place] [address]   Hgb A1c MFr Bld 06/25/2021 4.8  4.8 - 5.6 % Final   Comment: (NOTE) Pre diabetes:          5.7%-6.4%  Diabetes:              >6.4%  Glycemic control for   <7.0% adults with diabetes    Mean Plasma Glucose 06/25/2021 91.06  mg/dL Final   Performed at [facility], [address], [place], [place] [address]   Magnesium 06/25/2021 2.2  1.7 - 2.4 mg/dL Final   Performed at [facility], [address] [place], [place] [address]   Alcohol, Ethyl (B) 06/25/2021 12 (A) <10 mg/dL Final   Comment: (NOTE) Lowest detectable limit for serum alcohol is 10 mg/dL.  For medical purposes only. Performed at [facility], [address] [place], [place] [address]    TSH 06/25/2021 1.005  0.350 - 4.500 uIU/mL Final   Comment: Performed by a  3rd Generation assay with a functional sensitivity of <=0.01 uIU/mL. Performed at [facility], [address], [place], [place] [address]    Color, Urine 06/25/2021 YELLOW  YELLOW Final   APPearance 06/25/2021 CLEAR  CLEAR Final   Specific Gravity, Urine 06/25/2021 1.025  1.005 - 1.030 Final   pH 06/25/2021 6.0  5.0 - 8.0 Final   Glucose, UA 06/25/2021 NEGATIVE  NEGATIVE mg/dL Final   Hgb urine dipstick 06/25/2021 NEGATIVE  NEGATIVE Final   Bilirubin Urine 06/25/2021 NEGATIVE  NEGATIVE Final   Ketones, ur 06/25/2021 NEGATIVE  NEGATIVE mg/dL Final   Protein, ur 78/93/8101 NEGATIVE  NEGATIVE mg/dL Final   Nitrite 75/07/2584 NEGATIVE  NEGATIVE Final   Leukocytes,Ua 06/25/2021 NEGATIVE  NEGATIVE Final   Comment: Microscopic not done on urines with negative protein, blood, leukocytes, nitrite, or glucose < 500 mg/dL. Performed at [facility], [address], [place], [place] [address]    POC Amphetamine UR 06/25/2021 None Detected  NONE DETECTED (Cut Off Level 1000 ng/mL) Final   POC Secobarbital (BAR) 06/25/2021 None Detected  NONE DETECTED (Cut Off Level 300 ng/mL) Final   POC Buprenorphine (BUP) 06/25/2021 None Detected  NONE DETECTED (Cut Off Level 10 ng/mL) Final   POC Oxazepam (BZO) 06/25/2021 None Detected  NONE DETECTED (Cut Off Level 300 ng/mL) Final   POC Cocaine UR 06/25/2021 None Detected  NONE DETECTED (Cut Off Level 300 ng/mL) Final   POC Methamphetamine UR 06/25/2021 None Detected  NONE DETECTED (Cut Off Level 1000 ng/mL) Final   POC Morphine 06/25/2021 None Detected  NONE DETECTED (Cut Off Level 300 ng/mL) Final   POC Oxycodone UR 06/25/2021 None Detected  NONE DETECTED (Cut Off Level 100 ng/mL) Final   POC Methadone UR 06/25/2021 None Detected  NONE DETECTED (Cut Off Level 300 ng/mL) Final   POC Marijuana UR 06/25/2021 None Detected  NONE DETECTED (Cut Off Level 50 ng/mL) Final   Neisseria Gonorrhea 06/25/2021 Negative   Final   Chlamydia 06/25/2021 Negative    Final   Comment 06/25/2021 Normal Reference Ranger Chlamydia - Negative   Final   Comment 06/25/2021 Normal Reference Range Neisseria Gonorrhea - Negative   Final   SARSCOV2ONAVIRUS 2 AG  06/25/2021 NEGATIVE  NEGATIVE Final   Comment: (NOTE) SARS-CoV-2 antigen NOT DETECTED.   Negative results are presumptive.  Negative results do not preclude SARS-CoV-2 infection and should not be used as the sole basis for treatment or other patient management decisions, including infection  control decisions, particularly in the presence of clinical signs and  symptoms consistent with COVID-19, or in those who have been in contact with the virus.  Negative results must be combined with clinical observations, patient history, and epidemiological information. The expected result is Negative.  Fact Sheet for Patients: https://www.jennings-kim.com/  Fact Sheet for Healthcare Providers: https://alexander-rogers.biz/  This test is not yet approved or cleared by the [place] FDA and  has been authorized for detection and/or diagnosis of SARS-CoV-2 by FDA under an Emergency Use Authorization (EUA).  This EUA will remain in effect (meaning this test can be used) for the duration of  the COV                          ID-19 declaration under Section 564(b)(1) of the Act, 21 U.S.C. section 360bbb-3(b)(1), unless the authorization is terminated or revoked sooner.     Cholesterol 06/25/2021 181  0 - 200 mg/dL Final   Triglycerides 25/36/6440 135  <150 mg/dL Final   HDL 34/74/2595 80  >40 mg/dL Final   Total CHOL/HDL Ratio 06/25/2021 2.3  RATIO Final   VLDL 06/25/2021 27  0 - 40 mg/dL Final   LDL Cholesterol 06/25/2021 74  0 - 99 mg/dL Final   Comment:        Total Cholesterol/HDL:CHD Risk Coronary Heart Disease Risk Table                     Men   Women  1/2 Average Risk   3.4   3.3  Average Risk       5.0   4.4  2 X Average Risk   9.6   7.1  3 X Average Risk  23.4   11.0         Use the calculated Patient Ratio above and the CHD Risk Table to determine the patient's CHD Risk.        ATP III CLASSIFICATION (LDL):  <100     mg/dL   Optimal  638-756  mg/dL   Near or Above                    Optimal  130-159  mg/dL   Borderline  433-295  mg/dL   High  >188     mg/dL   Very High Performed at [facility], [address], [place], [place] [address]    Hepatitis B Surface Ag 06/25/2021 NON REACTIVE  NON REACTIVE Final   HCV Ab 06/25/2021 NON REACTIVE  NON REACTIVE Final   Comment: (NOTE) Nonreactive HCV antibody screen is consistent with no HCV infections,  unless recent infection is suspected or other evidence exists to indicate HCV infection.     Hep A IgM 06/25/2021 NON REACTIVE  NON REACTIVE Final   Hep B C IgM 06/25/2021 NON REACTIVE  NON REACTIVE Final   Performed at [facility], [address], [place], [place] [address]    Blood Alcohol level:  Lab Results  Component Value Date   ETH 12 (H) 06/25/2021   ETH 225 (H) 01/21/2016    Metabolic Disorder Labs: Lab Results  Component Value Date   HGBA1C  4.8 06/25/2021   MPG 91.06 06/25/2021   No results found for: PROLACTIN Lab Results  Component Value Date   CHOL 181 06/25/2021   TRIG 135 06/25/2021   HDL 80 06/25/2021   CHOLHDL 2.3 06/25/2021   VLDL 27 06/25/2021   LDLCALC 74 06/25/2021    Therapeutic Lab Levels: No results found for: LITHIUM No results found for: VALPROATE No components found for:  CBMZ  Physical Findings   PHQ2-9    Flowsheet Row ED from 06/25/2021 in [facility]  PHQ-2 Total Score 2  PHQ-9 Total Score 12      Flowsheet Row ED from 06/25/2021 in [facility]  C-SSRS RISK CATEGORY High Risk        Musculoskeletal  Strength & Muscle Tone: within normal limits Gait & Station: normal Patient leans: N/A  Psychiatric Specialty Exam  Presentation  General Appearance: Unkempt hair,  wearing scrub bottoms and sweater.   Eye Contact: Good  Speech:Clear and Coherent; Normal Rate  Speech Volume:Normal  Handedness: Not assessed   Mood and Affect  Mood: "my mood is kinda swaying"  Affect:Congruent; Cooperative, Full   Thought Process  Thought Processes:Coherent  Descriptions of Associations:Intact  Orientation:None  Thought Content:Logical; Scattered  Diagnosis of Schizophrenia or Schizoaffective disorder in past: No  Duration of Psychotic Symptoms: Less than six months   Hallucinations: Reports seeing pink rhinos last night as she was waking up from nightmare Ideas of Reference: None  Suicidal Thoughts:None Homicidal Thoughts:None  Sensorium  Memory:Immediate Good; Recent Good; Remote Good  Judgment:Poor  Insight:Poor   Executive Functions  Concentration:Good  Attention Span:Good  Recall:Good  Fund of Knowledge:Good  Language:Good   Psychomotor Activity  Psychomotor Activity: Normal  Assets  Assets:Communication Skills; Desire for Improvement; Financial Resources/Insurance; Leisure Time; Physical Health; Resilience   Sleep  Sleep: Fair  No data recorded  Physical Exam  Physical Exam Vitals reviewed.  Constitutional:      General: She is not in acute distress.    Appearance: She is not ill-appearing or toxic-appearing.  HENT:     Head: Normocephalic and atraumatic.     Right Ear: External ear normal.     Left Ear: External ear normal.     Nose: Nose normal.     Mouth/Throat:     Mouth: Mucous membranes are moist.  Eyes:     General: No scleral icterus.    Extraocular Movements: Extraocular movements intact.     Conjunctiva/sclera: Conjunctivae normal.  Pulmonary:     Effort: Pulmonary effort is normal.  Musculoskeletal:     Cervical back: Normal range of motion.  Neurological:     Mental Status: She is alert.   Review of Systems  All other systems reviewed and are negative. Blood pressure (!) 150/98, pulse  68, temperature 98.4 F (36.9 C), temperature source Oral, resp. rate 14, SpO2 100 %. There is no height or weight on file to calculate BMI.  Treatment Plan Summary: Daily contact with patient to assess and evaluate symptoms and progress in treatment Legna is a 60 y.o. female with a reported history of Bipolar I disorder and substance use who presented to [facility] with suicidal ideation. Patient is agreeable to [facility] admission. Labs ordered and reviewed - Resp panel, Covid POC, UDS, GC/Chlamydia negative; CBC, HgbA1c, Mg, TSH, UA, lipid panel, hepatitis panel wnl; EtOH slightly elevated 12; CMP Creatinine elevated 1.06, GFR 60. Patient's description of irritation and anger episodes does not seem to be consistent with  mania. Other diagnoses on the differential include PTSD and neurocognitive disorder. PTSD is likely due to her extensive trauma history and recurrent nightmares. She also reports taking SSRIs previously which she reports as helpful. Neurocognitive disorder is also possible due to her cognitive issues, irritability, mood instability, and difficulty controlling anger. Patient fits criteria for depressive episode and Seroquel is appropriate for cocaine use disorder and insomnia. Patient remains appropriate for treatment at [facility].    MDD with psychosis - Change Seroquel 50/75mg  to 100mg  qhs  - also some evidence in cocaine use d/o   Hypertension - BP was 136/93 this AM - Continue amlodipine 10mg  daily   Polysubstance use (Cocaine, alcohol) - Thiamine 100mg  daily - Nicoderm CQ 14mg /16hr patch - Ativan q6h PRN for CIWA >10   MSK - acetaminophen 650mg  PRN - Voltaren gel PRN  Social/behavioral - Attend groups at [facility] - SW assisting with bed placement   A Renae Mottley 06/27/2021 11:31 PM

## 2021-06-27 NOTE — Group Note (Signed)
Group Topic: Social Support  Group Date: 06/27/2021 Start Time: 1050 End Time: 1149 Facilitators: Juluis Pitch P  Department: [facility]  Number of Participants: 3  Group Focus: daily focus, goals/reality orientation, and healthy friendships Treatment Modality:  Leisure Development Interventions utilized were assignment, clarification, exploration, group exercise, and support Purpose: express feelings, express irrational fears, and reinforce self-care  Name: Debbie Cox Date of Birth: 11-Nov-1960  MR: [id number]    Level of Participation: active Quality of Participation: attentive, cooperative, engaged, initiates communication, motivated, offered feedback, and supportive Interactions with others: gave feedback Mood/Affect: appropriate, bright, brightens with interaction, and positive Triggers (if applicable): n/a Cognition: coherent/clear, concrete, goal directed, insightful, and logical Progress: Moderate Response: Pt engaged and state life goals Plan: follow-up needed  Patients Problems:  Patient Active Problem List   Diagnosis Date Noted   PTSD (post-traumatic stress disorder) 06/27/2021   MDD (major depressive disorder), recurrent, severe, with psychosis (HCC) 06/27/2021   Cocaine use disorder, moderate, in early remission (HCC) 06/27/2021   Alcohol use disorder, moderate, dependence (HCC) 06/27/2021   Bacterial vaginosis 06/27/2021

## 2021-06-27 NOTE — Progress Notes (Signed)
Patient is alert and oriented X 4 denies SI, HI and AVH. Patient states she is "Upset I even thought about the train." Patient is pleasant and cooperative. Patient attends group and is an active participant. Today patient was more visible out in the dinning room interacting with peers. Patient has no complaints of appetite or sleep.  Safety maintained. Nursing staff will continue to monitor.

## 2021-06-27 NOTE — ED Notes (Signed)
Pt asleep in bed. Respirations even and unlabored. Will continue to monitor for safety. ?

## 2021-06-27 NOTE — ED Notes (Addendum)
Patient is resting in bed, had to wake her to take scheduled Flagyl. Patient stated that she was having a nice dream when I woke her. Patient now resting, respiration unlabored. No signs symptoms of distress. Will continue to monitor for safety.

## 2021-06-27 NOTE — ED Notes (Signed)
Patient sitting in day room watching TV. She is calm and cooperative with care. States that she sees pink rhinos. And hears voice calling her name. States "I think Jesus is calling me, I hope it is not tonight". Her CIWA score was 6 at 8:00 p.m.Marland Kitchen Denies SI denies plan. Will continue to monitor for safety.

## 2021-06-28 DIAGNOSIS — F333 Major depressive disorder, recurrent, severe with psychotic symptoms: Secondary | ICD-10-CM | POA: Diagnosis not present

## 2021-06-28 DIAGNOSIS — F1421 Cocaine dependence, in remission: Secondary | ICD-10-CM | POA: Diagnosis not present

## 2021-06-28 DIAGNOSIS — R45851 Suicidal ideations: Secondary | ICD-10-CM | POA: Diagnosis not present

## 2021-06-28 DIAGNOSIS — F4310 Post-traumatic stress disorder, unspecified: Secondary | ICD-10-CM | POA: Diagnosis not present

## 2021-06-28 NOTE — ED Notes (Signed)
Patient denies pain and is resting comfortably.  

## 2021-06-28 NOTE — ED Notes (Signed)
Snacks given- banana, fruit cup, ghram cracker, and milk

## 2021-06-28 NOTE — Group Note (Signed)
Group Topic: Social Support  Group Date: 06/28/2021 Start Time: 1930 End Time: 1955 Facilitators: Armandina Stammer, [place]  Department: [facility]  Number of Participants: 3  Group Focus: check in Treatment Modality:  Cognitive Behavioral Therapy Interventions utilized were clarification, exploration, group exercise, story telling, and support Purpose: explore maladaptive thinking, express feelings, increase insight, and relapse prevention strategies  Name: Debbie Cox Date of Birth: November 04, 1960  MR: [id number]    Level of Participation: active Quality of Participation: attention seeking, distractible, distracting to others, engaged, intrusive, motivated, offered feedback, and supportive Interactions with others: gave feedback, intrusive, and monopolizing Mood/Affect: brightens with interaction Triggers (if applicable): n/a Cognition: goal directed and insightful Progress: Moderate Response: n/a Plan: follow-up needed  Patients Problems:  Patient Active Problem List   Diagnosis Date Noted   PTSD (post-traumatic stress disorder) 06/27/2021   MDD (major depressive disorder), recurrent, severe, with psychosis (HCC) 06/27/2021   Cocaine use disorder, moderate, in early remission (HCC) 06/27/2021   Alcohol use disorder, moderate, dependence (HCC) 06/27/2021   Bacterial vaginosis 06/27/2021

## 2021-06-28 NOTE — Progress Notes (Signed)
Pt is awake, alert and oriented. Pt did not voice any complaints of pain or discomfort. No signs of acute distress noted. Administered scheduled meds with no incident. Pt denies current SI/HI. Pt endorses current AVH. Pt stated " I see pink rhinos not elephants. Staff needs to get it right." Pt also stated that the pink rhinos only stare at her "crazy" when writer asked the pt to elaborate. Staff will monitor for pt's safety.

## 2021-06-28 NOTE — Progress Notes (Signed)
Behavioral Health Progress Note  Date and Time: 06/28/2021 6:47 PM Name: Debbie Cox MRN:  [id number]  Original note by Debbie Cox Gwyndolyn Kaufman. I attest that I listened to the student's presentation and reviewed and edited the student note. I further attest that the components of the history of the present illness,  and the assessment and plan documented were performed by the student in the  presence of me (teaching physician). I verified the documentation, personally performed the physical examination and I also verified the medical decision making.     Subjective:    Patient seen and chart reviewed. During the interview this AM, Debbie Cox was laying in bed with the blanket pulled up to her chin. She said that her heart was pounding. She denies SI, HI. She says that last night, she was walking in the hallway thinking about her plans after discharge and saw a pink rhino. She says that she doesn't feel as good as she did yesterday and kept waking up last night due to nightmares. She mentioned that she saw pink rhinos in her nightmare and that she saw a "tree with a rope hanging from it".  When she saw the pink rhinos prior to bedtime, she said that her heart was pounding, that she had trouble catching her breath, and that she "felt like something bad was going to happen. It was like I was in a haunted house." She said that she was having racing thoughts and kept holding onto things that other people had said. In discussing pink rhinos in more detail, she states that she first saw them when she was experiencing suicidal thoughts and describes seeing them in situations where she feels increased anxiety.  She expresses concerns about her future and how she was going to get back to work. She was also worrying about her substance use and whether she was going to be able to abstain from using alcohol and cocaine after discharge. She reports not using cocaine for 1 month prior to admission because of worries over  fentanyl contamination. She denies cravings currently. Debbie Cox and I spoke to SW who confirmed her ability to [facility]; patient informed of this and she expresses that she feels like she may be ready for discharge tomorrow.  Discussed  anxiety and she said clarified that her mood has been good, but her anxiety has increased. After talking about breathing techniques, she was able to sit up for the remainder of the interview. She was very interested to learn about box breathing and after a few breaths, she said that she felt calmer.   When asked about clarification over possible prior manic episodes, she reports that there were no episodes when she wasn't using cocaine.   Discussed starting SSRI for mood although patient declines at this time. She states that she will consider this in the future and discuss with her PCP if she feels that her mood is worsening.  Diagnosis:  Final diagnoses:  Bipolar I disorder, most recent episode depressed (HCC)    Total Time spent with patient: 45 minutes  Past Psychiatric History: See H&P Past Medical History:  Past Medical History:  Diagnosis Date   Anxiety    Bipolar disorder (HCC)    Depression    ETOH abuse    Hypertension    Joint pain    hands    Past Surgical History:  Procedure Laterality Date   CESAREAN SECTION     MULTIPLE EXTRACTIONS WITH ALVEOLOPLASTY Bilateral 12/20/2016   Procedure:  BILATERAL TORI;  Surgeon: Ocie Doyne, DDS;  Location: [facility] OR;  Service: Oral Surgery;  Laterality: Bilateral;   Family History:  Family History  Adopted: Yes   Family Psychiatric  History: See H&P Social History:  Social History   Substance and Sexual Activity  Alcohol Use Yes   Alcohol/week: 12.0 standard drinks   Types: 12 Glasses of wine per week     Social History   Substance and Sexual Activity  Drug Use No    Social History   Socioeconomic History   Marital status: Single    Spouse name: Not on file   Number of children:  Not on file   Years of education: Not on file   Highest education level: Not on file  Occupational History   Not on file  Tobacco Use   Smoking status: Every Day    Packs/day: 0.50    Years: 35.00    Pack years: 17.50    Types: Cigarettes   Smokeless tobacco: Never  Substance and Sexual Activity   Alcohol use: Yes    Alcohol/week: 12.0 standard drinks    Types: 12 Glasses of wine per week   Drug use: No   Sexual activity: Not on file  Other Topics Concern   Not on file  Social History Narrative   Not on file   Social Determinants of Health   Financial Resource Strain: Not on file  Food Insecurity: Not on file  Transportation Needs: Not on file  Physical Activity: Not on file  Stress: Not on file  Social Connections: Not on file   SDOH:  SDOH Screenings   Alcohol Screen: Not on file  Depression (PHQ2-9): Medium Risk   PHQ-2 Score: 12  Financial Resource Strain: Not on file  Food Insecurity: Not on file  Housing: Not on file  Physical Activity: Not on file  Social Connections: Not on file  Stress: Not on file  Tobacco Use: High Risk   Smoking Tobacco Use: Every Day   Smokeless Tobacco Use: Never  Transportation Needs: Not on file   Additional Social History:    Pain Medications: see MAR Prescriptions: see MAR Over the Counter: see MAr History of alcohol / drug use?: Yes Longest period of sobriety (when/how long): unknown Name of Substance 1: alcohol 1 - Age of First Use: 13 1 - Amount (size/oz): 2 pints [place] 1 - Frequency: daily 1 - Duration: ongoing 1 - Last Use / Amount: yesterday 2 pints 1 - Method of Aquiring: purchase 1- Route of Use: drink Name of Substance 2: cocaine (powder) 2 - Age of First Use: unknown 2 - Amount (size/oz): varies 2 - Frequency: infrequently 2 - Duration: occasionally 2 - Last Use / Amount: on her birthday this year (Feb, 2023) 2 - Method of Aquiring: friend 2 - Route of Substance Use: snort                 Sleep: Fair  Appetite:  Good  Current Medications:  Current Facility-Administered Medications  Medication Dose Route Frequency Provider Last Rate Last Admin   acetaminophen (TYLENOL) tablet 650 mg  650 mg Oral Q6H PRN Ardis Hughs, NP   650 mg at 06/28/21 0525   alum & mag hydroxide-simeth (MAALOX/MYLANTA) 200-200-20 MG/5ML suspension 30 mL  30 mL Oral Q4H PRN Ardis Hughs, NP       amLODipine (NORVASC) tablet 10 mg  10 mg Oral Daily Ardis Hughs, NP   10 mg at 06/28/21 [phone]  diclofenac Sodium (VOLTAREN) 1 % topical gel 1 application  1 application Topical BID PRN Cinderella, Margaret A       magnesium hydroxide (MILK OF MAGNESIA) suspension 30 mL  30 mL Oral Daily PRN Ardis Hughs, NP       metroNIDAZOLE (FLAGYL) tablet 500 mg  500 mg Oral BID Cinderella, Margaret A   500 mg at 06/28/21 0931   nicotine (NICODERM CQ - dosed in mg/24 hours) patch 14 mg  14 mg Transdermal Q0600 Cinderella, Margaret A   14 mg at 06/27/21 0554   QUEtiapine (SEROQUEL) tablet 100 mg  100 mg Oral QHS Cinderella, Margaret A   100 mg at 06/27/21 2123   thiamine tablet 100 mg  100 mg Oral Daily Ardis Hughs, NP   100 mg at 06/28/21 0931   traZODone (DESYREL) tablet 50 mg  50 mg Oral QHS PRN Ardis Hughs, NP       Current Outpatient Medications  Medication Sig Dispense Refill   amLODipine (NORVASC) 10 MG tablet Take 10 mg by mouth daily.     cetirizine (ZYRTEC) 10 MG tablet Take 10 mg by mouth daily.     lurasidone (LATUDA) 40 MG TABS tablet Take 1 tablet (40 mg total) by mouth daily with breakfast. 30 tablet 1   meloxicam (MOBIC) 7.5 MG tablet Take 7.5 mg by mouth daily.     metroNIDAZOLE (FLAGYL) 500 MG tablet Take 500 mg by mouth 2 (two) times daily.      Labs  Lab Results:  Admission on 06/25/2021  Component Date Value Ref Range Status   SARS Coronavirus 2 by RT PCR 06/25/2021 NEGATIVE  NEGATIVE Final   Comment: (NOTE) SARS-CoV-2 target nucleic acids are NOT  DETECTED.  The SARS-CoV-2 RNA is generally detectable in upper respiratory specimens during the acute phase of infection. The lowest concentration of SARS-CoV-2 viral copies this assay can detect is 138 copies/mL. A negative result does not preclude SARS-Cov-2 infection and should not be used as the sole basis for treatment or other patient management decisions. A negative result may occur with  improper specimen collection/handling, submission of specimen other than nasopharyngeal swab, presence of viral mutation(s) within the areas targeted by this assay, and inadequate number of viral copies(<138 copies/mL). A negative result must be combined with clinical observations, patient history, and epidemiological information. The expected result is Negative.  Fact Sheet for Patients:  BloggerCourse.com  Fact Sheet for Healthcare Providers:  SeriousBroker.it  This test is no                          t yet approved or cleared by the [place] FDA and  has been authorized for detection and/or diagnosis of SARS-CoV-2 by FDA under an Emergency Use Authorization (EUA). This EUA will remain  in effect (meaning this test can be used) for the duration of the COVID-19 declaration under Section 564(b)(1) of the Act, 21 U.S.C.section 360bbb-3(b)(1), unless the authorization is terminated  or revoked sooner.       Influenza A by PCR 06/25/2021 NEGATIVE  NEGATIVE Final   Influenza B by PCR 06/25/2021 NEGATIVE  NEGATIVE Final   Comment: (NOTE) The Xpert Xpress SARS-CoV-2/FLU/RSV plus assay is intended as an aid in the diagnosis of influenza from Nasopharyngeal swab specimens and should not be used as a sole basis for treatment. Nasal washings and aspirates are unacceptable for Xpert Xpress SARS-CoV-2/FLU/RSV testing.  Fact Sheet for Patients: BloggerCourse.com  Fact  Sheet for Healthcare  Providers: SeriousBroker.it  This test is not yet approved or cleared by the [place] and has been authorized for detection and/or diagnosis of SARS-CoV-2 by FDA under an Emergency Use Authorization (EUA). This EUA will remain in effect (meaning this test can be used) for the duration of the COVID-19 declaration under Section 564(b)(1) of the Act, 21 U.S.C. section 360bbb-3(b)(1), unless the authorization is terminated or revoked.  Performed at [facility], [address], [place], [place] [address]    SARS Coronavirus 2 Ag 06/25/2021 Negative  Negative Final   WBC 06/25/2021 6.0  4.0 - 10.5 K/uL Final   RBC 06/25/2021 4.44  3.87 - 5.11 MIL/uL Final   Hemoglobin 06/25/2021 12.8  12.0 - 15.0 g/dL Final   HCT 60/45/4098 39.4  36.0 - 46.0 % Final   MCV 06/25/2021 88.7  80.0 - 100.0 fL Final   MCH 06/25/2021 28.8  26.0 - 34.0 pg Final   MCHC 06/25/2021 32.5  30.0 - 36.0 g/dL Final   RDW 11/91/4782 14.3  11.5 - 15.5 % Final   Platelets 06/25/2021 167  150 - 400 K/uL Final   nRBC 06/25/2021 0.0  0.0 - 0.2 % Final   Neutrophils Relative % 06/25/2021 66  % Final   Neutro Abs 06/25/2021 3.9  1.7 - 7.7 K/uL Final   Lymphocytes Relative 06/25/2021 24  % Final   Lymphs Abs 06/25/2021 1.4  0.7 - 4.0 K/uL Final   Monocytes Relative 06/25/2021 7  % Final   Monocytes Absolute 06/25/2021 0.4  0.1 - 1.0 K/uL Final   Eosinophils Relative 06/25/2021 2  % Final   Eosinophils Absolute 06/25/2021 0.1  0.0 - 0.5 K/uL Final   Basophils Relative 06/25/2021 1  % Final   Basophils Absolute 06/25/2021 0.0  0.0 - 0.1 K/uL Final   Immature Granulocytes 06/25/2021 0  % Final   Abs Immature Granulocytes 06/25/2021 0.01  0.00 - 0.07 K/uL Final   Performed at [facility], [address] [place], [place] [address]   Sodium 06/25/2021 143  135 - 145 mmol/L Final   Potassium 06/25/2021 3.9  3.5 - 5.1 mmol/L Final   Chloride 06/25/2021 111  98 - 111 mmol/L Final    CO2 06/25/2021 22  22 - 32 mmol/L Final   Glucose, Bld 06/25/2021 75  70 - 99 mg/dL Final   Glucose reference range applies only to samples taken after fasting for at least 8 hours.   BUN 06/25/2021 16  8 - 23 mg/dL Final   Creatinine, Ser 06/25/2021 1.06 (A) 0.44 - 1.00 mg/dL Final   Calcium 30/86/5784 9.4  8.9 - 10.3 mg/dL Final   Total Protein 69/62/9528 7.4  6.5 - 8.1 g/dL Final   Albumin 41/32/4401 4.1  3.5 - 5.0 g/dL Final   AST 02/72/5366 34  15 - 41 U/L Final   ALT 06/25/2021 20  0 - 44 U/L Final   Alkaline Phosphatase 06/25/2021 84  38 - 126 U/L Final   Total Bilirubin 06/25/2021 0.5  0.3 - 1.2 mg/dL Final   GFR, Estimated 06/25/2021 60 (A) >60 mL/min Final   Comment: (NOTE) Calculated using the CKD-EPI Creatinine Equation (2021)    Anion gap 06/25/2021 10  5 - 15 Final   Performed at [facility], [address] [place], [place] [address]   Hgb A1c MFr Bld 06/25/2021 4.8  4.8 - 5.6 % Final   Comment: (NOTE) Pre diabetes:  5.7%-6.4%  Diabetes:              >6.4%  Glycemic control for   <7.0% adults with diabetes    Mean Plasma Glucose 06/25/2021 91.06  mg/dL Final   Performed at [facility], [address] [place], [place] [address]   Magnesium 06/25/2021 2.2  1.7 - 2.4 mg/dL Final   Performed at [facility], [address], [place], [place] [address]   Alcohol, Ethyl (B) 06/25/2021 12 (A) <10 mg/dL Final   Comment: (NOTE) Lowest detectable limit for serum alcohol is 10 mg/dL.  For medical purposes only. Performed at [facility], [address] [place], [place] [address]    TSH 06/25/2021 1.005  0.350 - 4.500 uIU/mL Final   Comment: Performed by a 3rd Generation assay with a functional sensitivity of <=0.01 uIU/mL. Performed at [facility], [address] [place], [place] [address]    Color, Urine 06/25/2021 YELLOW  YELLOW Final   APPearance 06/25/2021 CLEAR  CLEAR Final   Specific Gravity, Urine 06/25/2021 1.025   1.005 - 1.030 Final   pH 06/25/2021 6.0  5.0 - 8.0 Final   Glucose, UA 06/25/2021 NEGATIVE  NEGATIVE mg/dL Final   Hgb urine dipstick 06/25/2021 NEGATIVE  NEGATIVE Final   Bilirubin Urine 06/25/2021 NEGATIVE  NEGATIVE Final   Ketones, ur 06/25/2021 NEGATIVE  NEGATIVE mg/dL Final   Protein, ur 29/56/2130 NEGATIVE  NEGATIVE mg/dL Final   Nitrite 86/57/8469 NEGATIVE  NEGATIVE Final   Leukocytes,Ua 06/25/2021 NEGATIVE  NEGATIVE Final   Comment: Microscopic not done on urines with negative protein, blood, leukocytes, nitrite, or glucose < 500 mg/dL. Performed at [facility], [address] [place], [place] [address]    POC Amphetamine UR 06/25/2021 None Detected  NONE DETECTED (Cut Off Level 1000 ng/mL) Final   POC Secobarbital (BAR) 06/25/2021 None Detected  NONE DETECTED (Cut Off Level 300 ng/mL) Final   POC Buprenorphine (BUP) 06/25/2021 None Detected  NONE DETECTED (Cut Off Level 10 ng/mL) Final   POC Oxazepam (BZO) 06/25/2021 None Detected  NONE DETECTED (Cut Off Level 300 ng/mL) Final   POC Cocaine UR 06/25/2021 None Detected  NONE DETECTED (Cut Off Level 300 ng/mL) Final   POC Methamphetamine UR 06/25/2021 None Detected  NONE DETECTED (Cut Off Level 1000 ng/mL) Final   POC Morphine 06/25/2021 None Detected  NONE DETECTED (Cut Off Level 300 ng/mL) Final   POC Oxycodone UR 06/25/2021 None Detected  NONE DETECTED (Cut Off Level 100 ng/mL) Final   POC Methadone UR 06/25/2021 None Detected  NONE DETECTED (Cut Off Level 300 ng/mL) Final   POC Marijuana UR 06/25/2021 None Detected  NONE DETECTED (Cut Off Level 50 ng/mL) Final   Neisseria Gonorrhea 06/25/2021 Negative   Final   Chlamydia 06/25/2021 Negative   Final   Comment 06/25/2021 Normal Reference Ranger Chlamydia - Negative   Final   Comment 06/25/2021 Normal Reference Range Neisseria Gonorrhea - Negative   Final   SARSCOV2ONAVIRUS 2 AG 06/25/2021 NEGATIVE  NEGATIVE Final   Comment: (NOTE) SARS-CoV-2 antigen NOT DETECTED.    Negative results are presumptive.  Negative results do not preclude SARS-CoV-2 infection and should not be used as the sole basis for treatment or other patient management decisions, including infection  control decisions, particularly in the presence of clinical signs and  symptoms consistent with COVID-19, or in those who have been in contact with the virus.  Negative results must be combined with clinical  observations, patient history, and epidemiological information. The expected result is Negative.  Fact Sheet for Patients: https://www.jennings-kim.com/  Fact Sheet for Healthcare Providers: https://alexander-rogers.biz/  This test is not yet approved or cleared by the [place] FDA and  has been authorized for detection and/or diagnosis of SARS-CoV-2 by FDA under an Emergency Use Authorization (EUA).  This EUA will remain in effect (meaning this test can be used) for the duration of  the COV                          ID-19 declaration under Section 564(b)(1) of the Act, 21 U.S.C. section 360bbb-3(b)(1), unless the authorization is terminated or revoked sooner.     Cholesterol 06/25/2021 181  0 - 200 mg/dL Final   Triglycerides 16/07/9603 135  <150 mg/dL Final   HDL 54/06/8118 80  >40 mg/dL Final   Total CHOL/HDL Ratio 06/25/2021 2.3  RATIO Final   VLDL 06/25/2021 27  0 - 40 mg/dL Final   LDL Cholesterol 06/25/2021 74  0 - 99 mg/dL Final   Comment:        Total Cholesterol/HDL:CHD Risk Coronary Heart Disease Risk Table                     Men   Women  1/2 Average Risk   3.4   3.3  Average Risk       5.0   4.4  2 X Average Risk   9.6   7.1  3 X Average Risk  23.4   11.0        Use the calculated Patient Ratio above and the CHD Risk Table to determine the patient's CHD Risk.        ATP III CLASSIFICATION (LDL):  <100     mg/dL   Optimal  147-829  mg/dL   Near or Above                    Optimal  130-159  mg/dL   Borderline  562-130   mg/dL   High  >865     mg/dL   Very High Performed at [facility], [address] [place], [place] [address]    Hepatitis B Surface Ag 06/25/2021 NON REACTIVE  NON REACTIVE Final   HCV Ab 06/25/2021 NON REACTIVE  NON REACTIVE Final   Comment: (NOTE) Nonreactive HCV antibody screen is consistent with no HCV infections,  unless recent infection is suspected or other evidence exists to indicate HCV infection.     Hep A IgM 06/25/2021 NON REACTIVE  NON REACTIVE Final   Hep B C IgM 06/25/2021 NON REACTIVE  NON REACTIVE Final   Performed at [facility], [address] [place], [place] [address]    Blood Alcohol level:  Lab Results  Component Value Date   ETH 12 (H) 06/25/2021   ETH 225 (H) 01/21/2016    Metabolic Disorder Labs: Lab Results  Component Value Date   HGBA1C 4.8 06/25/2021   MPG 91.06 06/25/2021   No results found for: PROLACTIN Lab Results  Component Value Date   CHOL 181 06/25/2021   TRIG 135 06/25/2021   HDL 80 06/25/2021   CHOLHDL 2.3 06/25/2021   VLDL 27 06/25/2021   LDLCALC 74 06/25/2021    Therapeutic Lab Levels: No results found for: LITHIUM No results found for: VALPROATE No components found for:  CBMZ  Physical Findings   PHQ2-9  Flowsheet Row ED from 06/25/2021 in [facility]  PHQ-2 Total Score 2  PHQ-9 Total Score 12      Flowsheet Row ED from 06/25/2021 in [facility]  C-SSRS RISK CATEGORY High Risk        Musculoskeletal  Strength & Muscle Tone: within normal limits Gait & Station: normal Patient leans: N/A  Psychiatric Specialty Exam  Presentation  General Appearance: Unkempt hair, wearing sweater  Eye Contact: Good  Speech:Clear and Coherent; Normal Rate  Speech Volume:Normal  Handedness: Not assessed   Mood and Affect  Mood: "my mood is good. The Seroquel levels me out."  Affect:Congruent; Cooperative, Full, Anxious   Thought Process   Thought Processes:Coherent, Logical, Goal-directed  Descriptions of Associations:Intact  Orientation: Full (Person, place, time, situation)  Thought Content:Logical  Diagnosis of Schizophrenia or Schizoaffective disorder in past: No  Duration of Psychotic Symptoms: Less than six months   Hallucinations:None during interview. Saw pink rhinos last night Ideas of Reference:None  Suicidal Thoughts:None Homicidal Thoughts:None  Sensorium  Memory:Immediate Good; Recent Good; Remote Good  Judgment: Fair  Insight:Fair   Executive Functions  Concentration:Good  Attention Span:Good  Recall:Good  Fund of Knowledge:Good  Language:Good   Psychomotor Activity  Psychomotor Activity: Normal  Assets  Assets:Communication Skills; Desire for Improvement; Financial Resources/Insurance; Leisure Time; Physical Health; Resilience   Sleep  Sleep: Fair. Seroquel helps with sleep. Nightmares last night.  Physical Exam  Physical Exam Vitals reviewed.  Constitutional:      General: She is not in acute distress.    Appearance: She is not ill-appearing or toxic-appearing.  HENT:     Head: Normocephalic and atraumatic.     Right Ear: External ear normal.     Left Ear: External ear normal.     Nose: Nose normal.  Eyes:     General: No scleral icterus.    Extraocular Movements: Extraocular movements intact.     Conjunctiva/sclera: Conjunctivae normal.  Pulmonary:     Effort: Pulmonary effort is normal.  Musculoskeletal:        General: Normal range of motion.     Cervical back: Normal range of motion.  Skin:    General: Skin is warm and dry.  Neurological:     General: No focal deficit present.     Mental Status: She is alert.   Review of Systems  All other systems reviewed and are negative. Blood pressure (!) 144/99, pulse (!) 57, temperature (!) 97.1 F (36.2 C), temperature source Tympanic, resp. rate 16, SpO2 100 %. There is no height or weight on file to calculate  BMI.  Treatment Plan Summary: Daily contact with patient to assess and evaluate symptoms and progress in treatment Mayling is a 60 y.o. female with a reported history of Bipolar I disorder and substance use who presented to [facility] with suicidal ideation. Patient is agreeable to [facility] admission. Labs ordered and reviewed - Resp panel, Covid POC, UDS, GC/Chlamydia negative; CBC, HgbA1c, Mg, TSH, UA, lipid panel, hepatitis panel wnl; EtOH slightly elevated 12; CMP Creatinine elevated 1.06, GFR 60. Patient's description of irritation and anger episodes does not seem to be consistent with mania. Other diagnoses on the differential include PTSD and neurocognitive disorder. PTSD is likely due to her extensive trauma history and recurrent nightmares. She also reports taking SSRIs previously which she reports as helpful. Neurocognitive disorder is also possible due to her cognitive issues, irritability, mood instability, and difficulty controlling anger. 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 Patient fits criteria for depressive episode and Seroquel is appropriate for cocaine use disorder and insomnia. Discussed adding SSRI for anxiety and MDD. Patient declines at this time, but considering starting at a later time. Patient remains appropriate for treatment at [facility].    MDD with psychosis - Seroquel 100mg  qhs -offered prozac 20 mg to patient although she declines at this time - also some evidence in cocaine use d/o  Hypertension - BP was 144/99 this AM - Continue amlodipine 10mg  daily   Polysubstance use (Cocaine, alcohol) - Thiamine 100mg  daily - Nicoderm CQ 14mg /16hr patch - Ativan q6h PRN for CIWA >10   MSK - acetaminophen 650mg  q6h PRN  Cognitive impairment 24 on MOCA administered 9/14 -would likely benefit from neurology follow up after discharge   Social/behavioral - Attend groups at [facility] - SW assisting with bed  placement  08-19-1982, MD 06/28/2021 6:47 PM

## 2021-06-28 NOTE — Progress Notes (Signed)
Pt is presently resting quietly in her room. No signs of acute distress noted. Pt attended [facility] group therapy and participated. No concern voiced. Pt's safety is maintained.

## 2021-06-28 NOTE — Group Note (Signed)
Group Topic: Balance in Life  Group Date: 06/28/2021 Start Time: 1000 End Time: 1100 Facilitators: Mart Piggs, NT  Department: [facility]  Number of Participants: 4  Group Focus: affirmation Treatment Modality:  Behavior Modification Therapy Interventions utilized were group exercise Purpose: enhance coping skills  The group was based around mindfulness and meditation. We had open ended discussion, I am big on letting the group lead or change topic based off the need of the people attending the group as long as it is appropriate. The four that did attended, all participate, and added great information to the session. Some open up about personal experiences and thing that bother them. We gathered new information that we could use to make life better. The group was great.. we all really enjoyed and gained insight from one anther. We encouraged each other on how to do thing to; self improve our life with writing out our goals and sticking to them. Self esteem, stress management, and taking time out to calm the brain and center ourself.   Name: Debbie Cox Date of Birth: [birth year]  MR: [id number]    Level of Participation: active Quality of Participation: cooperative Interactions with others: gave feedback Mood/Affect: appropriate Triggers (if applicable): n/a Cognition: insightful Progress: Moderate Response: Dealing with childhood trama, is still a issue.  Plan: patient will be encouraged to, write som personal, goals. Deal with childhood trama a little at a time.   Patients Problems:  Patient Active Problem List   Diagnosis Date Noted   PTSD (post-traumatic stress disorder) 06/27/2021   MDD (major depressive disorder), recurrent, severe, with psychosis (HCC) 06/27/2021   Cocaine use disorder, moderate, in early remission (HCC) 06/27/2021   Alcohol use disorder, moderate, dependence (HCC) 06/27/2021   Bacterial vaginosis 06/27/2021

## 2021-06-28 NOTE — ED Notes (Signed)
Patient woke up about 5 a.m. complaining of pain in her shoulder and back. 6 out of 10. She requested Tylenol 650 mg. Her nicotine patch was removed and she received another patch at 5:50. Will continue to monitor for safety.

## 2021-06-28 NOTE — Clinical Social Work Psych Note (Signed)
CSW Update  CSW received a return call from Shelton Silvas, Interior and spatial designer of [facility].   According to Debbie Cox (patient) can return to her bed placement at the [facility] starting on Friday, 06/29/2021.   CSW informed Debbie Cox of the update and she expressed gratitude and understanding.   Debbie Cox denied having any other questions or concerns at this time.   CSW will continue to follow   Baldo Daub, MSW, LCSW Clinical Social Worker (Facility Based Crisis) [facility]

## 2021-06-28 NOTE — ED Notes (Signed)
Pt A&O x 4, no distress noted, calm & cooperative.  Watching TV at present.  Monitoring for safety. 

## 2021-06-29 DIAGNOSIS — F333 Major depressive disorder, recurrent, severe with psychotic symptoms: Secondary | ICD-10-CM | POA: Diagnosis not present

## 2021-06-29 DIAGNOSIS — F1421 Cocaine dependence, in remission: Secondary | ICD-10-CM | POA: Diagnosis not present

## 2021-06-29 DIAGNOSIS — F4310 Post-traumatic stress disorder, unspecified: Secondary | ICD-10-CM | POA: Diagnosis not present

## 2021-06-29 DIAGNOSIS — R45851 Suicidal ideations: Secondary | ICD-10-CM | POA: Diagnosis not present

## 2021-06-29 MED ORDER — QUETIAPINE FUMARATE 100 MG PO TABS: 100.0000 mg | ORAL_TABLET | Freq: Every day | ORAL | 1 refills | Status: DC

## 2021-06-29 MED ORDER — ENSURE ENLIVE PO LIQD
237.0000 mL | Freq: Once | ORAL | Status: AC
  Administered 2021-06-29: 237 mL via ORAL

## 2021-06-29 MED ORDER — DICLOFENAC SODIUM 1 % EX GEL: 1.0000 "application " | Freq: Two times a day (BID) | CUTANEOUS | 1 refills | Status: AC | PRN

## 2021-06-29 MED ORDER — THIAMINE HCL 100 MG PO TABS: 100.0000 mg | ORAL_TABLET | Freq: Every day | ORAL | 1 refills | Status: AC

## 2021-06-29 MED ORDER — NICOTINE 14 MG/24HR TD PT24: 14.0000 mg | MEDICATED_PATCH | Freq: Every day | TRANSDERMAL | 1 refills | Status: AC

## 2021-06-29 MED ORDER — AMLODIPINE BESYLATE 10 MG PO TABS: 10.0000 mg | ORAL_TABLET | Freq: Every day | ORAL | 1 refills | Status: DC

## 2021-06-29 MED ORDER — METRONIDAZOLE 500 MG PO TABS: 500.0000 mg | ORAL_TABLET | Freq: Two times a day (BID) | ORAL | 0 refills | Status: AC

## 2021-06-29 NOTE — Discharge Instructions (Signed)

## 2021-06-29 NOTE — ED Notes (Signed)
Debbie Cox slept all night with no complaints or distress noted

## 2021-06-29 NOTE — ED Provider Notes (Signed)
FBC/OBS ASAP Discharge Summary  Date and Time: 06/29/2021 4:31 PM  Name: Debbie Cox  MRN:  [id number]   Discharge Diagnoses:  Final diagnoses:  MDD (major depressive disorder), recurrent, severe, with psychosis (HCC)  PTSD (post-traumatic stress disorder)  Cocaine use disorder, moderate, in early remission (HCC)    Subjective:  Patient seen and chart reviewed. She reports doing well today and describes her mood as "pretty good". She expresses that she feels that the medications have been  helpful. She reports sleeping well last night. She expresses some anxiety about discharge today but is  looking forward [facility]. She reports feeling improved since her admission and has found the medications to be helpful. She denies SI/HI/AVH.   Stay Summary:  Patient presented to the [facility] on 9/12 after she presented with depressoin and SI related to living in a shelter the last 3 months. Patient was admitted to the [facility] and was started on seroquel 50 mg BID and amlodipine 10 mg. On 9/13, patient was noted to be sedated and seroquel was adjusted to 75 mg qhs and then 100 mg qhs on 9/14. On 9/14 she was also started on flagyl 500 mg BID x 7 days for bacterial vaginosis; patient had reported she had been prescribed the medication recently although did not complete the course. Medical student administered a MOCA on 9/14-patient scored 24 which is c/w mild cognitive impairment. Patient tolerated medications well without SE. Discussed SSRI with patient on 9/15; however, patient declined at this time  Patient was provided with 7 day samples of home medications as well as a 30 day script with one refill.  Total Time spent with patient: 20 minutes  Past Psychiatric History: see H&P Past Medical History:  Past Medical History:  Diagnosis Date   Anxiety    Bipolar disorder (HCC)    Depression    ETOH abuse    Hypertension    Joint pain    hands    Past Surgical History:   Procedure Laterality Date   CESAREAN SECTION     MULTIPLE EXTRACTIONS WITH ALVEOLOPLASTY Bilateral 12/20/2016   Procedure: BILATERAL TORI;  Surgeon: Ocie Doyne, DDS;  Location: [facility] OR;  Service: Oral Surgery;  Laterality: Bilateral;   Family History:  Family History  Adopted: Yes   Family Psychiatric History: see H&P Social History:  Social History   Substance and Sexual Activity  Alcohol Use Yes   Alcohol/week: 12.0 standard drinks   Types: 12 Glasses of wine per week     Social History   Substance and Sexual Activity  Drug Use No    Social History   Socioeconomic History   Marital status: Single    Spouse name: Not on file   Number of children: Not on file   Years of education: Not on file   Highest education level: Not on file  Occupational History   Not on file  Tobacco Use   Smoking status: Every Day    Packs/day: 0.50    Years: 35.00    Pack years: 17.50    Types: Cigarettes   Smokeless tobacco: Never  Substance and Sexual Activity   Alcohol use: Yes    Alcohol/week: 12.0 standard drinks    Types: 12 Glasses of wine per week   Drug use: No   Sexual activity: Not on file  Other Topics Concern   Not on file  Social History Narrative   Not on file   Social Determinants of Health  Financial Resource Strain: Not on file  Food Insecurity: Not on file  Transportation Needs: Not on file  Physical Activity: Not on file  Stress: Not on file  Social Connections: Not on file   SDOH:  SDOH Screenings   Alcohol Screen: Not on file  Depression (PHQ2-9): Medium Risk   PHQ-2 Score: 12  Financial Resource Strain: Not on file  Food Insecurity: Not on file  Housing: Not on file  Physical Activity: Not on file  Social Connections: Not on file  Stress: Not on file  Tobacco Use: High Risk   Smoking Tobacco Use: Every Day   Smokeless Tobacco Use: Never  Transportation Needs: Not on file    Tobacco Cessation:  A prescription for an FDA-approved tobacco  cessation medication provided at discharge  Current Medications:  Current Facility-Administered Medications  Medication Dose Route Frequency Provider Last Rate Last Admin   acetaminophen (TYLENOL) tablet 650 mg  650 mg Oral Q6H PRN Ardis Hughs, NP   650 mg at 06/29/21 0800   alum & mag hydroxide-simeth (MAALOX/MYLANTA) 200-200-20 MG/5ML suspension 30 mL  30 mL Oral Q4H PRN Ardis Hughs, NP       amLODipine (NORVASC) tablet 10 mg  10 mg Oral Daily Ardis Hughs, NP   10 mg at 06/29/21 7628   diclofenac Sodium (VOLTAREN) 1 % topical gel 1 application  1 application Topical BID PRN Cinderella, Margaret A   1 application at 06/29/21 0801   magnesium hydroxide (MILK OF MAGNESIA) suspension 30 mL  30 mL Oral Daily PRN Ardis Hughs, NP       metroNIDAZOLE (FLAGYL) tablet 500 mg  500 mg Oral BID Cinderella, Margaret A   500 mg at 06/29/21 0958   nicotine (NICODERM CQ - dosed in mg/24 hours) patch 14 mg  14 mg Transdermal Q0600 Cinderella, Margaret A   14 mg at 06/29/21 0958   QUEtiapine (SEROQUEL) tablet 100 mg  100 mg Oral QHS Cinderella, Margaret A   100 mg at 06/28/21 2117   thiamine tablet 100 mg  100 mg Oral Daily Ardis Hughs, NP   100 mg at 06/29/21 3151   traZODone (DESYREL) tablet 50 mg  50 mg Oral QHS PRN Ardis Hughs, NP       Current Outpatient Medications  Medication Sig Dispense Refill   cetirizine (ZYRTEC) 10 MG tablet Take 10 mg by mouth daily.     amLODipine (NORVASC) 10 MG tablet Take 1 tablet (10 mg total) by mouth daily. 30 tablet 1   diclofenac Sodium (VOLTAREN) 1 % GEL Apply 1 application topically 2 (two) times daily as needed (please offer to pt in AM). 2 g 1   metroNIDAZOLE (FLAGYL) 500 MG tablet Take 1 tablet (500 mg total) by mouth 2 (two) times daily for 10 doses. 10 tablet 0   [START ON 06/30/2021] nicotine (NICODERM CQ - DOSED IN MG/24 HOURS) 14 mg/24hr patch Place 1 patch (14 mg total) onto the skin daily at 6 (six) AM. 28 patch 1    QUEtiapine (SEROQUEL) 100 MG tablet Take 1 tablet (100 mg total) by mouth at bedtime. 30 tablet 1   [START ON 06/30/2021] thiamine 100 MG tablet Take 1 tablet (100 mg total) by mouth daily. 30 tablet 1    PTA Medications: (Not in a hospital admission)   Musculoskeletal  Strength & Muscle Tone: within normal limits Gait & Station: normal Patient leans: N/A  Psychiatric Specialty Exam  Presentation  General Appearance: Appropriate  for Environment; Casual  Eye Contact:Good  Speech:Clear and Coherent; Other (comment) (rapid at times)  Speech Volume:Other (comment) (generally normal, but increased at time)  Handedness:Right   Mood and Affect  Mood:Euthymic  Affect:Appropriate; Congruent; Other (comment) (bright)   Thought Process  Thought Processes:Coherent; Goal Directed; Linear  Descriptions of Associations:Intact  Orientation:Full (Time, Place and Person)  Thought Content:Logical  Diagnosis of Schizophrenia or Schizoaffective disorder in past: No  Duration of Psychotic Symptoms: Less than six months   Hallucinations:Hallucinations: None  Ideas of Reference:None  Suicidal Thoughts:Suicidal Thoughts: No  Homicidal Thoughts:Homicidal Thoughts: No   Sensorium  Memory:Immediate Good; Recent Good; Remote Fair  Judgment:Fair  Insight:Fair   Executive Functions  Concentration:Good  Attention Span:Good  Recall:Good  Fund of Knowledge:Good  Language:Good   Psychomotor Activity  Psychomotor Activity:Psychomotor Activity: Normal   Assets  Assets:Communication Skills; Desire for Improvement; Financial Resources/Insurance; Physical Health; Resilience   Sleep  Sleep:Sleep: Fair   No data recorded  Physical Exam  See SRA for physical exam and ROS  Blood pressure (!) 117/94, pulse 61, temperature (!) 96.9 F (36.1 C), temperature source Tympanic, resp. rate 18, SpO2 100 %. There is no height or weight on file to calculate BMI.  See SRA for full  suicide risk assessment  Plan Of Care/Follow-up recommendations:  Activity:  as tolerated Diet:  regular Other:    Patient is instructed prior to discharge to: Take all medications as prescribed by his/her mental healthcare provider. Report any adverse effects and or reactions from the medicines to his/her outpatient provider promptly. Patient has been instructed & cautioned: To not engage in alcohol and or illegal drug use while on prescription medicines. In the event of worsening symptoms, patient is instructed to call the crisis hotline, 911 and or go to the nearest ED for appropriate evaluation and treatment of symptoms. To follow-up with his/her primary care provider for your other medical issues, concerns and or health care needs.   Patient provided with 7 day samples of medications as well as 30 day scripts with one refill.   TAKE these medications    amLODipine 10 MG tablet Commonly known as: NORVASC Take 1 tablet (10 mg total) by mouth daily.   cetirizine 10 MG tablet Commonly known as: ZYRTEC Take 10 mg by mouth daily.   diclofenac Sodium 1 % Gel Commonly known as: VOLTAREN Apply 1 application topically 2 (two) times daily as needed (please offer to pt in AM).   metroNIDAZOLE 500 MG tablet Commonly known as: FLAGYL Take 1 tablet (500 mg total) by mouth 2 (two) times daily for 10 doses.   nicotine 14 mg/24hr patch Commonly known as: NICODERM CQ - dosed in mg/24 hours Place 1 patch (14 mg total) onto the skin daily at 6 (six) AM. Start taking on: June 30, 2021   QUEtiapine 100 MG tablet Commonly known as: SEROQUEL Take 1 tablet (100 mg total) by mouth at bedtime.   thiamine 100 MG tablet Take 1 tablet (100 mg total) by mouth daily. Start taking on: June 30, 2021         Disposition: self care to [facility]    Estella Husk, MD 06/29/2021, 4:31 PM

## 2021-06-29 NOTE — Clinical Social Work Psych Note (Signed)
CSW Discharge  CSW spoke with Debbie Cox this morning to assess any additional concerns or needs and to further discuss discharge plans.   Debbie Cox was informed yesterday, that she could return to her [facility] shelter bed today. Debbie Cox was thankful and stated that she would be ready.   This morning, Debbie Cox reports that she was slightly nervous about returning to the community, however she felt confident that her caseworkers and supports at [facility] would assist her if needed.   Debbie Cox states that she is ready to discharge. CSW informed her that the provider would meet with her to make the determination. Debbie Cox expressed understanding.   Debbie Cox will follow up with the [facility] for continuity of are for medication management and therapy services.   Debbie Cox is returning to the [facility] and will need assistance with transportation via [employer] at discharge.    Debbie Cox did not express any additional concerns or questions.   CSW will continue to follow until discharge.   Baldo Daub, MSW, LCSW Clinical Child psychotherapist (Facility Based Crisis) [facility]

## 2021-06-29 NOTE — ED Provider Notes (Addendum)
[facility] Discharge Suicide Risk Assessment   Principal Problem: MDD (major depressive disorder), recurrent, severe, with psychosis (HCC) Discharge Diagnoses: Principal Problem:   MDD (major depressive disorder), recurrent, severe, with psychosis (HCC) Active Problems:   PTSD (post-traumatic stress disorder)   Cocaine use disorder, moderate, in early remission (HCC)   Alcohol use disorder, moderate, dependence (HCC)   Bacterial vaginosis   Total Time spent with patient: 20 minutes  Musculoskeletal: Strength & Muscle Tone: within normal limits Gait & Station: normal Patient leans: N/A  Psychiatric Specialty Exam  Presentation  General Appearance: Appropriate for Environment; Casual  Eye Contact:Good  Speech:Clear and Coherent; Other (comment) (rapid at times)  Speech Volume:Other (comment) (generally normal, but increased at time)  Handedness:Right   Mood and Affect  Mood:Euthymic  Duration of Depression Symptoms: Greater than two weeks  Affect:Appropriate; Congruent; Other (comment) (bright)   Thought Process  Thought Processes:Coherent; Goal Directed; Linear  Descriptions of Associations:Intact  Orientation:Full (Time, Place and Person)  Thought Content:Logical  History of Schizophrenia/Schizoaffective disorder:No  Duration of Psychotic Symptoms:Less than six months  Hallucinations:Hallucinations: None  Ideas of Reference:None  Suicidal Thoughts:Suicidal Thoughts: No  Homicidal Thoughts:Homicidal Thoughts: No   Sensorium  Memory:Immediate Good; Recent Good; Remote Fair  Judgment:Fair  Insight:Fair   Executive Functions  Concentration:Good  Attention Span:Good  Recall:Good  Fund of Knowledge:Good  Language:Good   Psychomotor Activity  Psychomotor Activity:Psychomotor Activity: Normal   Assets  Assets:Communication Skills; Desire for Improvement; Financial Resources/Insurance; Physical Health; Resilience   Sleep  Sleep:Sleep:  Fair   Physical Exam: Physical Exam Constitutional:      Appearance: Normal appearance. She is normal weight.  HENT:     Head: Normocephalic and atraumatic.  Eyes:     Extraocular Movements: Extraocular movements intact.     Conjunctiva/sclera: Conjunctivae normal.  Pulmonary:     Effort: Pulmonary effort is normal.  Neurological:     General: No focal deficit present.     Mental Status: She is alert.   Review of Systems  Constitutional:  Negative for chills and fever.  HENT:  Negative for hearing loss.   Eyes:  Negative for discharge and redness.  Respiratory:  Negative for cough.   Cardiovascular:  Negative for chest pain.  Gastrointestinal:  Negative for abdominal pain.  Musculoskeletal:  Negative for myalgias.  Neurological:  Negative for headaches.  Psychiatric/Behavioral:  Positive for substance abuse. Negative for depression, hallucinations and suicidal ideas. The patient is nervous/anxious.   Blood pressure (!) 117/94, pulse 61, temperature (!) 96.9 F (36.1 C), temperature source Tympanic, resp. rate 18, SpO2 100 %. There is no height or weight on file to calculate BMI.  Mental Status Per Nursing Assessment::   On Admission:   reported SI on admission; denies currently  Demographic Factors:  Low socioeconomic status  Loss Factors: NA  Historical Factors: Impulsivity and substance use, trauma history  Risk Reduction Factors:   Sense of responsibility to family, Employed, Positive social support, and Positive coping skills or problem solving skills  Continued Clinical Symptoms:  Alcohol/Substance Abuse/Dependencies  Cognitive Features That Contribute To Risk:  None    Suicide Risk:  Minimal: No identifiable suicidal ideation.  Patients presenting with no risk factors but with morbid ruminations; may be classified as minimal risk based on the severity of the depressive symptoms   Follow-up Information     [facility]. Go to.    Specialty: Behavioral Health Why: Please go during walk-in hours to establish outpatient medication management and therapy  services to ensure continuity of care.   Medication Management Walk-in Hours: Monday-Friday from 8:00am-11:00am. Please arrive between 7:30am-7:45am , as patients are seen on a first come, first served basis.   Therapy Walk-In hours: Monday-Wednesday 8:00am-until slots are full. Please arrive between 7:30am-7:45am , as patients are seen on a first come, first served basis. Friday from 1:00pm-5:00pm. Contact information: [address] [place] [address] [phone]                Plan Of Care/Follow-up recommendations:  Activity:  as tolerated Diet:  regular Other:    Patient is instructed prior to discharge to: Take all medications as prescribed by his/her mental healthcare provider. Report any adverse effects and or reactions from the medicines to his/her outpatient provider promptly. Patient has been instructed & cautioned: To not engage in alcohol and or illegal drug use while on prescription medicines. In the event of worsening symptoms, patient is instructed to call the crisis hotline, 911 and or go to the nearest ED for appropriate evaluation and treatment of symptoms. To follow-up with his/her primary care provider for your other medical issues, concerns and or health care needs.   Patient provided with 7 day samples as well as scripts for 30 days and one refill at discharge.   Estella Husk, MD 06/29/2021, 11:07 AM

## 2021-06-29 NOTE — ED Notes (Signed)
Patient up eating breakfast.   

## 2021-06-29 NOTE — Progress Notes (Signed)
Patient given AM medications. Stated Tylenol was effective for pain.  Patient going to group.

## 2021-06-29 NOTE — Progress Notes (Signed)
Reviewed AVS with patient. Patient given prescriptions and samples.  All belongings returned.  Patient ambulated independently to [facility] port without issue.  Discharged in stable condition; no acute distress noted.

## 2021-06-29 NOTE — ED Notes (Signed)
Pt given breakfast; 2 cereal, bagel with cream cheese, fruit cup.

## 2021-06-29 NOTE — ED Notes (Signed)
Patient complaining of chronic pain along shoulders top of back.  Given Tylenol and Voltaren cream per eMAR.

## 2021-07-11 ENCOUNTER — Encounter: Payer: Self-pay | Admitting: Physician Assistant

## 2021-07-11 NOTE — Progress Notes (Addendum)
Debbie Cox is here today.  She describes multiple medications, but there were some med interactions.  She grew up in foster care, her mother had some psychiatric issues (by her description).  Recently seen in Psych ER, started on Seroquel 100 mg. She is disturbed because one of her bottles says quetiapine. Dr Lowell Guitar was able to reassure her on this.  In the ER MD notes, it says she has mild cognitive impairment.  She wants to take 1/2 tab but everyone has told her not to cut the tablet.   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  Last Friday or Saturday, she fell off a truck and had some L calf pain. The pain is getting better and she was given Voltaren gel today.   She needs the allergy pill and thiamine filled.   She missed or canceled the appt w/ Dr Corliss Skains w/ rheumatology. She would like another one, but needs transportation and to be reminded of it.  She also wants to see a podiatrist, she has corns between her toes.  138/90, HR 92, SPO2 97%  She wants a note to say that she can come in an lay down.   Dr Lowell Guitar will ask Myriam Jacobson about that.  She drinks ETOH, says a beer a day.  Theodore Demark, PA-C 07/11/2021 3:32 PM  Seen and examined with Theodore Demark at [facility].  Tangential during this visit, many concerns.  Concerns about drowsiness in AM with seroquel, discussed trying this an hour earlier than she's taking now (7 PM).  MSK pain to L calf, no concerning findings on exam, no swelling, erythema, or TTP, she's walking well without significant limp.  Tinea pedis -> We'll work on podiatry follow up for her (gave lamisil to use between 4th and 5th toes on L foot) - sounds like she needs PCP follow up before referral can be made.  She needs to follow up with psych for additional management of her psych meds.      Lacretia Nicks, MD

## 2021-07-11 NOTE — Progress Notes (Deleted)
Office Visit Note  Patient: Debbie Cox             Date of Birth: 09-28-1961           MRN: [id number]             PCP: Drue Flirt, MD Referring: Drue Flirt, MD Visit Date: 07/25/2021 Occupation: _0 @  Subjective:  No chief complaint on file.   History of Present Illness: Debbie Cox is a 60 y.o. female ***   Activities of Daily Living:  Patient reports morning stiffness for *** {minute/hour:19697}.   Patient {ACTIONS;DENIES/REPORTS:21021675::"Denies"} nocturnal pain.  Difficulty dressing/grooming: {ACTIONS;DENIES/REPORTS:21021675::"Denies"} Difficulty climbing stairs: {ACTIONS;DENIES/REPORTS:21021675::"Denies"} Difficulty getting out of chair: {ACTIONS;DENIES/REPORTS:21021675::"Denies"} Difficulty using hands for taps, buttons, cutlery, and/or writing: {ACTIONS;DENIES/REPORTS:21021675::"Denies"}  No Rheumatology ROS completed.   PMFS History:  Patient Active Problem List   Diagnosis Date Noted   PTSD (post-traumatic stress disorder) 06/27/2021   MDD (major depressive disorder), recurrent, severe, with psychosis ([place]) 06/27/2021   Cocaine use disorder, moderate, in early remission ([place]) 06/27/2021   Alcohol use disorder, moderate, dependence ([place]) 06/27/2021   Bacterial vaginosis 06/27/2021    Past Medical History:  Diagnosis Date   Anxiety    Bipolar disorder ([place])    Depression    ETOH abuse    Hypertension    Joint pain    hands    Family History  Adopted: Yes   Past Surgical History:  Procedure Laterality Date   CESAREAN SECTION     MULTIPLE EXTRACTIONS WITH ALVEOLOPLASTY Bilateral 12/20/2016   Procedure: BILATERAL TORI;  Surgeon: Diona Browner, DDS;  Location: [facility];  Service: Oral Surgery;  Laterality: Bilateral;   Social History   Social History Narrative   Not on file    There is no immunization history on file for this patient.   Objective: Vital Signs: There were no vitals taken for this visit.   Physical Exam    Musculoskeletal Exam: ***  CDAI Exam: CDAI Score: -- Patient Global: --; Provider Global: -- Swollen: --; Tender: -- Joint Exam 07/25/2021   No joint exam has been documented for this visit   There is currently no information documented on the homunculus. Go to the Rheumatology activity and complete the homunculus joint exam.  Investigation: No additional findings.  Imaging: No results found.  Recent Labs: Lab Results  Component Value Date   WBC 6.0 06/25/2021   HGB 12.8 06/25/2021   PLT 167 06/25/2021   NA 143 06/25/2021   K 3.9 06/25/2021   CL 111 06/25/2021   CO2 22 06/25/2021   GLUCOSE 75 06/25/2021   BUN 16 06/25/2021   CREATININE 1.06 (H) 06/25/2021   BILITOT 0.5 06/25/2021   ALKPHOS 84 06/25/2021   AST 34 06/25/2021   ALT 20 06/25/2021   PROT 7.4 06/25/2021   ALBUMIN 4.1 06/25/2021   CALCIUM 9.4 06/25/2021   GFRAA 58 (L) 12/12/2016    Speciality Comments: No specialty comments available.  Procedures:  No procedures performed Allergies: Penicillins and Niacin and related   Assessment / Plan:     Visit Diagnoses: Polyarthralgia  Rheumatoid factor positive - 04/25/21: RF 14.3, ANA-, anti-CCP-, ESR 26, uric acid 6.4, TSH 0.452, Vitamind D 29.9, HIV-, HCV-  Essential hypertension  Other insomnia  Anxiety and depression  History of bipolar disorder  ETOH abuse  PTSD (post-traumatic stress disorder)  Orders: No orders of the defined types were placed in this encounter.  No orders of the defined types were placed in this encounter.  Face-to-face time spent with patient was *** minutes. Greater than 50% of time was spent in counseling and coordination of care.  Follow-Up Instructions: No follow-ups on file.   Ofilia Neas, PA-C  Note - This record has been created using Dragon software.  Chart creation errors have been sought, but may not always  have been located. Such creation errors do not reflect on  the standard of medical  care.

## 2021-07-18 ENCOUNTER — Other Ambulatory Visit: Payer: Self-pay

## 2021-07-18 ENCOUNTER — Ambulatory Visit (HOSPITAL_COMMUNITY)
Admission: EM | Admit: 2021-07-18 | Discharge: 2021-07-19 | Disposition: A | Discharge status: Home / Self Care | Payer: Medicaid Other | Attending: Student | Admitting: Student

## 2021-07-18 DIAGNOSIS — F1290 Cannabis use, unspecified, uncomplicated: Secondary | ICD-10-CM | POA: Insufficient documentation

## 2021-07-18 DIAGNOSIS — Z20822 Contact with and (suspected) exposure to covid-19: Secondary | ICD-10-CM | POA: Diagnosis not present

## 2021-07-18 DIAGNOSIS — Z5900 Homelessness unspecified: Secondary | ICD-10-CM | POA: Insufficient documentation

## 2021-07-18 DIAGNOSIS — F17210 Nicotine dependence, cigarettes, uncomplicated: Secondary | ICD-10-CM | POA: Insufficient documentation

## 2021-07-18 DIAGNOSIS — F4310 Post-traumatic stress disorder, unspecified: Secondary | ICD-10-CM | POA: Insufficient documentation

## 2021-07-18 DIAGNOSIS — Z9151 Personal history of suicidal behavior: Secondary | ICD-10-CM | POA: Insufficient documentation

## 2021-07-18 DIAGNOSIS — F1420 Cocaine dependence, uncomplicated: Secondary | ICD-10-CM | POA: Insufficient documentation

## 2021-07-18 DIAGNOSIS — F333 Major depressive disorder, recurrent, severe with psychotic symptoms: Secondary | ICD-10-CM

## 2021-07-18 DIAGNOSIS — F319 Bipolar disorder, unspecified: Secondary | ICD-10-CM | POA: Insufficient documentation

## 2021-07-18 DIAGNOSIS — F1010 Alcohol abuse, uncomplicated: Secondary | ICD-10-CM | POA: Insufficient documentation

## 2021-07-18 DIAGNOSIS — Z79899 Other long term (current) drug therapy: Secondary | ICD-10-CM | POA: Insufficient documentation

## 2021-07-18 LAB — POC SARS CORONAVIRUS 2 AG -  ED: SARS Coronavirus 2 Ag: NEGATIVE

## 2021-07-18 MED ORDER — AMLODIPINE BESYLATE 10 MG PO TABS
10.0000 mg | ORAL_TABLET | Freq: Every day | ORAL | Status: DC
  Administered 2021-07-18: 10 mg via ORAL
  Filled 2021-07-18 (×2): qty 1

## 2021-07-18 MED ORDER — LORATADINE 10 MG PO TABS
10.0000 mg | ORAL_TABLET | Freq: Every day | ORAL | Status: DC
  Administered 2021-07-19: 10 mg via ORAL
  Filled 2021-07-18: qty 1

## 2021-07-18 MED ORDER — HYDROXYZINE HCL 25 MG PO TABS: 25.0000 mg | ORAL_TABLET | Freq: Three times a day (TID) | ORAL | Status: DC | PRN

## 2021-07-18 MED ORDER — ACETAMINOPHEN 325 MG PO TABS
650.0000 mg | ORAL_TABLET | Freq: Four times a day (QID) | ORAL | Status: DC | PRN
Start: 2021-07-18 — End: 2021-07-19
  Administered 2021-07-18 – 2021-07-19 (×2): 650 mg via ORAL
  Filled 2021-07-18 (×2): qty 2

## 2021-07-18 MED ORDER — OLANZAPINE 5 MG PO TABS
5.0000 mg | ORAL_TABLET | Freq: Every day | ORAL | Status: DC
  Administered 2021-07-18: 5 mg via ORAL
  Filled 2021-07-18: qty 1

## 2021-07-18 MED ORDER — ALUM & MAG HYDROXIDE-SIMETH 200-200-20 MG/5ML PO SUSP: 30.0000 mL | ORAL | Status: DC | PRN

## 2021-07-18 MED ORDER — THIAMINE HCL 100 MG PO TABS
100.0000 mg | ORAL_TABLET | Freq: Every day | ORAL | Status: DC
  Administered 2021-07-19: 100 mg via ORAL
  Filled 2021-07-18: qty 1

## 2021-07-18 MED ORDER — TRAZODONE HCL 50 MG PO TABS
50.0000 mg | ORAL_TABLET | Freq: Every evening | ORAL | Status: DC | PRN
  Administered 2021-07-18: 50 mg via ORAL
  Filled 2021-07-18: qty 1

## 2021-07-18 MED ORDER — MAGNESIUM HYDROXIDE 400 MG/5ML PO SUSP
30.0000 mL | Freq: Every day | ORAL | Status: DC | PRN
Start: 2021-07-18 — End: 2021-07-19

## 2021-07-18 MED ORDER — NICOTINE 14 MG/24HR TD PT24
14.0000 mg | MEDICATED_PATCH | Freq: Every day | TRANSDERMAL | Status: DC
  Administered 2021-07-19: 14 mg via TRANSDERMAL
  Filled 2021-07-18: qty 1

## 2021-07-18 NOTE — ED Provider Notes (Addendum)
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 https://alexander-rogers.biz/  This test is not yet approved or cleared by the [place] FDA and  has been authorized for detection and/or diagnosis of SARS-CoV-2 by FDA under an Emergency Use Authorization (EUA).  This EUA will remain in effect (meaning this test can be used) for the duration of  the COV                          ID-19 declaration under Section 564(b)(1) of the Act, 21 U.S.C. section 360bbb-3(b)(1), unless the authorization is terminated or revoked sooner.     Cholesterol 06/25/2021 181  0 - 200 mg/dL Final   Triglycerides 51/11/5850 135  <150 mg/dL Final   HDL 77/82/4235 80  >40 mg/dL Final   Total CHOL/HDL Ratio 06/25/2021 2.3  RATIO Final   VLDL 06/25/2021 27  0 - 40 mg/dL Final   LDL Cholesterol 06/25/2021  74  0 - 99 mg/dL Final   Comment:        Total Cholesterol/HDL:CHD Risk Coronary Heart Disease Risk Table                     Men   Women  1/2 Average Risk   3.4   3.3  Average Risk       5.0   4.4  2 X Average Risk   9.6   7.1  3 X Average Risk  23.4   11.0        Use the calculated Patient Ratio above and the CHD Risk Table to determine the patient's CHD Risk.        ATP III CLASSIFICATION (LDL):  <100     mg/dL   Optimal  361-443  mg/dL   Near or Above                    Optimal  130-159  mg/dL   Borderline  154-008  mg/dL   High  >676     mg/dL   Very High Performed at [facility], [address], [place], [place] [address]    Hepatitis B Surface Ag 06/25/2021 NON REACTIVE  NON REACTIVE Final   HCV Ab 06/25/2021 NON REACTIVE  NON REACTIVE Final   Comment: (NOTE) Nonreactive HCV antibody screen is consistent with no HCV infections,  unless recent infection is suspected or other evidence exists to indicate HCV infection.     Hep A IgM 06/25/2021 NON  REACTIVE  NON REACTIVE Final   Hep B C IgM 06/25/2021 NON REACTIVE  NON REACTIVE Final   Performed at [facility], [address], [place], [place] [address]    Allergies: Penicillins and Niacin and related  PTA Medications: (Not in a hospital admission)   Medical Decision Making  Patient is a 60 year old female with past psychiatric history significant for MDD recurrent severe with psychosis, PTSD, moderate cocaine use disorder, and moderate alcohol use disorder with dependence, as well as past medical history significant for bacterial vaginosis and hypertension, who presents to the [facility] behavioral health urgent care [facility]) unaccompanied as a voluntary walk-in via [employer] examiner. Based on patient's current presentation of worsening depression, recent SI, visual hallucinations, believe that the patient's psychiatric condition has decompensated to the point that the patient is having significant  difficulty with functioning in her activities of daily living and also believe that the patient appears to be a potential threat to herself at this time.  Based on these details, believe that the patient meets criteria for inpatient psychiatric treatment or readmission to [facility] at this time.  Discussed treatment options including inpatient psychiatric treatment and [facility] readmission with the patient and after this discussion with the patient, patient states that she prefers to begin with overnight continuous assessment at this time with psychiatry reevaluation on 07/19/2021.  Thus, will admit patient to overnight [facility] continuous assessment.    Recommendations  Based on my evaluation the patient does not appear to have an emergency medical condition.  Patient will be admitted to [facility] overnight continuous assessment for further crisis stabilization and treatment.  Patient will be reevaluated by the treatment team on 07/19/2021 and disposition to be determined at that time.  Labs/tests ordered and reviewed:  -Urine pregnancy and urine drug screen: Patient unable to provide a urine specimen at this time.  Patient encouraged to provide urine specimen the next time that she has to use the restroom so that these labs can be obtained.  -Ethanol level: Elevated at 94 mg/dL-patient does not appear to be significantly intoxicated at this time.  -PCR Flu A&B, COVID: Negative  -CBC with differential: Within normal limits  -CMP: Serum creatinine slightly elevated at 1.12 mg/dL, estimated GFR slightly reduced at 56 mL/min.  These values appear to be in baseline range for the patient based on previous lab values (serum creatinine values of 1.06 mg/dL 3 weeks ago, 6.04 mg/dL 4 years ago, 5.40 mg/dL 5 years ago, 9.81 mg/dL 6 years ago, estimated GFR 60 mg/dL 3 weeks ago).  Thus based on these past values compared to current values and patient's current presentation, these current lab values do not appear to be emergent or  indicative of AKI at this time.  CMP otherwise unremarkable.  -Patient has lipid panel, TSH, and hemoglobin A1c labs done during her previous [facility] admission on 06/25/2021.  06/25/2021 lipid panel within normal limits.  06/25/21 TSH within normal limits at 1.005 uIU/mL.  Hemoglobin A1c within normal limits at 4.8%.  Thus, due to these labs being done recently and being within normal limits, these labs do not need to be repeated at this time.  -EKG ordered to check patient's QT/QTC due to patient being on antipsychotic medication.  Patient's EKG shows normal sinus rhythm with sinus arrhythmia and is without any acute/concerning findings or signs of ischemia with ventricular rate 64 bpm, PR interval 134 ms, QRS duration 92 ms, QT/QTC 422/435 ms.  Patient's QT/QTC is appropriate to continue antipsychotic  medications at this time.  Due to patient expressing displeasure with currently taking Seroquel, stating that she believes the medication has made her feel more paranoid and has increased her SI (see HPI for details), as well as due to patient's report that she has had trials of other antipsychotic medications such as Latuda, Risperdal, and Saphris in the past, which were not very effective for her symptoms, discussed with the patient discontinuing patient's Seroquel and initiating Zyprexa 5 mg nightly for patient's visual hallucinations/psychotic symptoms.  Patient educated on side effect profile as well as necessary labs/EKG monitoring parameters for Zyprexa/other antipsychotic medications and patient verbalizes understanding of this education.  After discussion, patient agrees to discontinue Seroquel and initiate Zyprexa 5 mg nightly at this time.  Will stop Seroquel at this time and initiate Zyprexa 5 mg p.o. daily at bedtime for visual hallucinations/psychosis with the hope that patient tolerates this antipsychotic medication better than Seroquel and other past reported antipsychotic medications.  Discussed with  the patient initiating an SSRI medication to help target the patient's depressive symptoms.  After discussion of this, patient declined to initiate an SSRI medication at this time.  Thus, no SSRI medication initiated at this time.  Although the patient's ethanol level is elevated at 94 mg/dL (which would indicate that patient may not be a reliable historian regarding her alcohol and substance use and is likely drinking more than the 1 shot of Hennessy daily that she reported), patient denies history of alcohol withdrawal symptoms, DTs, or seizures and does not appear to be experiencing any alcohol withdrawal at this time.  Additionally, per chart review of patient's 06/25/2021 - 06/29/21 [facility] stay, it does not appear that the patient ever required any doses of Ativan/benzodiazepines while she was on a CIWA protocol. Thus, no CIWA protocol initiated at this time.  Vistaril 25 mg p.o. 3 times daily as needed ordered for anxiety. Trazodone 50 mg p.o. daily at bedtime as needed for sleep ordered. Patient educated on side effect profiles of Vistaril and trazodone and patient verbalizes understanding of this education.  We will continue the following home medications:  -Amlodipine 10 mg p.o. daily for hypertension   -Patient reports that she has not taken this medication since last night on 07/17/2021.  Patient's blood pressure is elevated at 158/98.  Thus, will give patient's amlodipine 10 mg tonight and patient will be able to receive her normal scheduled daily dose of amlodipine 10 mg tomorrow morning on 07/19/2021 as long as her blood pressure is elevated enough to receive the medication tomorrow morning as well.   -Claritin 10 mg p.o. daily for seasonal allergies (formulary alternative the patient's home medication of Zyrtec 10 mg p.o. daily)  -Nicotine patch 14 mg p.o. daily ordered for tobacco cessation/nicotine cravings  -Thiamine tablet 100 mg p.o. daily for nutritional supplementation  Jaclyn Shaggy,  PA-C 07/19/21  12:11 AM

## 2021-07-19 ENCOUNTER — Inpatient Hospital Stay (HOSPITAL_COMMUNITY): Admission: RE | Admit: 2021-07-19 | Payer: Medicaid Other | Source: Intra-hospital | Admitting: Psychiatry

## 2021-07-19 LAB — CBC WITH DIFFERENTIAL/PLATELET
Abs Immature Granulocytes: 0.01 10*3/uL (ref 0.00–0.07)
Basophils Absolute: 0 10*3/uL (ref 0.0–0.1)
Basophils Relative: 1 %
Eosinophils Absolute: 0.2 10*3/uL (ref 0.0–0.5)
Eosinophils Relative: 2 %
HCT: 37.9 % (ref 36.0–46.0)
Hemoglobin: 12.4 g/dL (ref 12.0–15.0)
Immature Granulocytes: 0 %
Lymphocytes Relative: 33 %
Lymphs Abs: 2.2 10*3/uL (ref 0.7–4.0)
MCH: 28.5 pg (ref 26.0–34.0)
MCHC: 32.7 g/dL (ref 30.0–36.0)
MCV: 87.1 fL (ref 80.0–100.0)
Monocytes Absolute: 0.4 10*3/uL (ref 0.1–1.0)
Monocytes Relative: 7 %
Neutro Abs: 3.9 10*3/uL (ref 1.7–7.7)
Neutrophils Relative %: 57 %
Platelets: 245 10*3/uL (ref 150–400)
RBC: 4.35 MIL/uL (ref 3.87–5.11)
RDW: 14.1 % (ref 11.5–15.5)
WBC: 6.8 10*3/uL (ref 4.0–10.5)
nRBC: 0 % (ref 0.0–0.2)

## 2021-07-19 LAB — COMPREHENSIVE METABOLIC PANEL
ALT: 14 U/L (ref 0–44)
AST: 22 U/L (ref 15–41)
Albumin: 4 g/dL (ref 3.5–5.0)
Alkaline Phosphatase: 92 U/L (ref 38–126)
Anion gap: 12 (ref 5–15)
BUN: 13 mg/dL (ref 8–23)
CO2: 25 mmol/L (ref 22–32)
Calcium: 9.3 mg/dL (ref 8.9–10.3)
Chloride: 106 mmol/L (ref 98–111)
Creatinine, Ser: 1.12 mg/dL — ABNORMAL HIGH (ref 0.44–1.00)
GFR, Estimated: 56 mL/min — ABNORMAL LOW (ref >60)
Glucose, Bld: 86 mg/dL (ref 70–99)
Potassium: 3.9 mmol/L (ref 3.5–5.1)
Sodium: 143 mmol/L (ref 135–145)
Total Bilirubin: 0.4 mg/dL (ref 0.3–1.2)
Total Protein: 7.1 g/dL (ref 6.5–8.1)

## 2021-07-19 LAB — RESP PANEL BY RT-PCR (FLU A&B, COVID) ARPGX2
Influenza A by PCR: NEGATIVE
Influenza B by PCR: NEGATIVE
SARS Coronavirus 2 by RT PCR: NEGATIVE

## 2021-07-19 LAB — ETHANOL: Alcohol, Ethyl (B): 94 mg/dL — ABNORMAL HIGH (ref <10)

## 2021-07-19 NOTE — ED Notes (Signed)
Pt alert, oriented, and ambulatory.  Breathing is even and  unlabored.  Will continue to monitor for safety.

## 2021-07-19 NOTE — ED Notes (Signed)
Pt discharged with  AVS.  AVS reviewed prior to discharge.  Pt alert, oriented, and ambulatory.  Safety maintained.  °
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
## 2021-07-19 NOTE — ED Notes (Signed)
Pt given breakfast.
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
## 2021-07-19 NOTE — Discharge Instructions (Addendum)

## 2021-07-24 ENCOUNTER — Ambulatory Visit: Payer: Medicaid Other | Admitting: Rheumatology

## 2021-07-25 ENCOUNTER — Emergency Department (HOSPITAL_COMMUNITY): Payer: Medicaid Other

## 2021-07-25 ENCOUNTER — Other Ambulatory Visit: Payer: Self-pay

## 2021-07-25 ENCOUNTER — Emergency Department (HOSPITAL_COMMUNITY)
Admission: EM | Admit: 2021-07-25 | Discharge: 2021-07-25 | Disposition: A | Discharge status: Home / Self Care | Payer: Medicaid Other | Attending: Emergency Medicine | Admitting: Emergency Medicine

## 2021-07-25 ENCOUNTER — Ambulatory Visit: Payer: Medicaid Other | Admitting: Rheumatology

## 2021-07-25 ENCOUNTER — Encounter (HOSPITAL_COMMUNITY): Payer: Self-pay | Admitting: *Deleted

## 2021-07-25 DIAGNOSIS — S0990XA Unspecified injury of head, initial encounter: Secondary | ICD-10-CM | POA: Diagnosis present

## 2021-07-25 DIAGNOSIS — Z8659 Personal history of other mental and behavioral disorders: Secondary | ICD-10-CM

## 2021-07-25 DIAGNOSIS — M25562 Pain in left knee: Secondary | ICD-10-CM | POA: Diagnosis not present

## 2021-07-25 DIAGNOSIS — M545 Low back pain, unspecified: Secondary | ICD-10-CM | POA: Insufficient documentation

## 2021-07-25 DIAGNOSIS — I10 Essential (primary) hypertension: Secondary | ICD-10-CM

## 2021-07-25 DIAGNOSIS — F4310 Post-traumatic stress disorder, unspecified: Secondary | ICD-10-CM

## 2021-07-25 DIAGNOSIS — M542 Cervicalgia: Secondary | ICD-10-CM | POA: Insufficient documentation

## 2021-07-25 DIAGNOSIS — Z79899 Other long term (current) drug therapy: Secondary | ICD-10-CM | POA: Diagnosis not present

## 2021-07-25 DIAGNOSIS — F32A Depression, unspecified: Secondary | ICD-10-CM

## 2021-07-25 DIAGNOSIS — F17210 Nicotine dependence, cigarettes, uncomplicated: Secondary | ICD-10-CM | POA: Insufficient documentation

## 2021-07-25 DIAGNOSIS — S0083XA Contusion of other part of head, initial encounter: Secondary | ICD-10-CM | POA: Diagnosis not present

## 2021-07-25 DIAGNOSIS — M2550 Pain in unspecified joint: Secondary | ICD-10-CM

## 2021-07-25 DIAGNOSIS — G4709 Other insomnia: Secondary | ICD-10-CM

## 2021-07-25 DIAGNOSIS — F1010 Alcohol abuse, uncomplicated: Secondary | ICD-10-CM

## 2021-07-25 DIAGNOSIS — R768 Other specified abnormal immunological findings in serum: Secondary | ICD-10-CM

## 2021-07-25 MED ORDER — IBUPROFEN 800 MG PO TABS
800.0000 mg | ORAL_TABLET | Freq: Once | ORAL | Status: AC
  Administered 2021-07-25: 800 mg via ORAL
  Filled 2021-07-25: qty 2

## 2021-07-25 MED ORDER — ACETAMINOPHEN 325 MG PO TABS: 650.0000 mg | ORAL_TABLET | Freq: Once | ORAL | Status: DC
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
## 2021-07-25 NOTE — Discharge Instructions (Addendum)
You were seen in the ED today and all of your images returned normal.   Please use Tylenol or ibuprofen for pain.  You may use 600 mg ibuprofen every 6 hours or 1000 mg of Tylenol every 6 hours.  You may choose to alternate between the 2.  This would be most effective.  Not to exceed 4 g of Tylenol within 24 hours.  Not to exceed 3200 mg ibuprofen 24 hours.

## 2021-07-26 ENCOUNTER — Ambulatory Visit (HOSPITAL_COMMUNITY)
Admission: EM | Admit: 2021-07-26 | Discharge: 2021-07-26 | Disposition: A | Discharge status: Home / Self Care | Payer: Medicaid Other | Attending: Nurse Practitioner | Admitting: Nurse Practitioner

## 2021-07-26 DIAGNOSIS — Z5901 Sheltered homelessness: Secondary | ICD-10-CM | POA: Insufficient documentation

## 2021-07-26 DIAGNOSIS — R45851 Suicidal ideations: Secondary | ICD-10-CM | POA: Insufficient documentation

## 2021-07-26 DIAGNOSIS — F333 Major depressive disorder, recurrent, severe with psychotic symptoms: Secondary | ICD-10-CM | POA: Insufficient documentation

## 2021-07-26 DIAGNOSIS — R519 Headache, unspecified: Secondary | ICD-10-CM | POA: Insufficient documentation

## 2021-07-26 MED ORDER — ACETAMINOPHEN 500 MG PO TABS
1000.0000 mg | ORAL_TABLET | Freq: Once | ORAL | Status: AC
  Administered 2021-07-26: 1000 mg via ORAL
  Filled 2021-07-26: qty 2
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
## 2021-07-26 NOTE — Discharge Instructions (Addendum)
Therapy Walk-in Hours  Monday-Wednesday: 8 AM until slots are full  Friday: 1 PM to 5 PM  For Monday to Wednesday, it is recommended that patients arrive between 7:30 AM and 7:45 AM because patients will be seen in the order of arrival.  For Friday, we ask that patients arrive between 12 PM to 12:30 PM.  Go to the second floor on arrival and check in.  **Availability is limited; therefore, patients may not be seen on the same day.**  Medication management walk-ins:  Monday to Friday: 8 AM to 11 AM.  It is recommended that patients arrive by 7:30 AM to 7:45 AM because patients will be seen in the order of arrival.  Go to the second floor on arrival and check in.  **Availability is limited; therefore, patients may not be seen on the same day.**   Discharge recommendations:  Patient is to take medications as prescribed. Please see information for follow-up appointment with psychiatry and therapy. Please follow up with your primary care provider for all medical related needs.   Therapy: We recommend that patient participate in individual therapy to address mental health concerns.   Atypical antipsychotics: If you are prescribed an atypical antipsychotic, it is recommended that your height, weight, BMI, blood pressure, fasting lipid panel, and fasting blood sugar be monitored by your outpatient providers.  Safety:  The patient should abstain from use of illicit substances/drugs and abuse of any medications. If symptoms worsen or do not continue to improve or if the patient becomes actively suicidal or homicidal then it is recommended that the patient return to the closest hospital emergency department, the [facility], or call 911 for further evaluation and treatment. National Suicide Prevention Lifeline 1-800-SUICIDE or 1-800-273-8255.  About 988 988 offers 24/7 access to trained crisis counselors who can help people experiencing mental health-related  distress. People can call or text 988 or chat 988lifeline.org for themselves or if they are worried about a loved one who may need crisis support.  

## 2021-07-29 ENCOUNTER — Other Ambulatory Visit: Payer: Self-pay

## 2021-07-29 ENCOUNTER — Emergency Department (HOSPITAL_COMMUNITY): Payer: Medicaid Other

## 2021-07-29 ENCOUNTER — Emergency Department (HOSPITAL_COMMUNITY)
Admission: EM | Admit: 2021-07-29 | Discharge: 2021-07-29 | Disposition: A | Discharge status: Home / Self Care | Payer: Medicaid Other | Attending: Emergency Medicine | Admitting: Emergency Medicine

## 2021-07-29 DIAGNOSIS — M791 Myalgia, unspecified site: Secondary | ICD-10-CM | POA: Insufficient documentation

## 2021-07-29 DIAGNOSIS — F17210 Nicotine dependence, cigarettes, uncomplicated: Secondary | ICD-10-CM | POA: Insufficient documentation

## 2021-07-29 DIAGNOSIS — R0981 Nasal congestion: Secondary | ICD-10-CM | POA: Diagnosis not present

## 2021-07-29 DIAGNOSIS — Z20822 Contact with and (suspected) exposure to covid-19: Secondary | ICD-10-CM | POA: Diagnosis not present

## 2021-07-29 DIAGNOSIS — R051 Acute cough: Secondary | ICD-10-CM | POA: Diagnosis not present

## 2021-07-29 DIAGNOSIS — I10 Essential (primary) hypertension: Secondary | ICD-10-CM | POA: Diagnosis not present

## 2021-07-29 DIAGNOSIS — R059 Cough, unspecified: Secondary | ICD-10-CM | POA: Diagnosis present

## 2021-07-29 DIAGNOSIS — Z79899 Other long term (current) drug therapy: Secondary | ICD-10-CM | POA: Diagnosis not present

## 2021-07-29 LAB — RESP PANEL BY RT-PCR (FLU A&B, COVID) ARPGX2
Influenza A by PCR: NEGATIVE
Influenza B by PCR: NEGATIVE
SARS Coronavirus 2 by RT PCR: NEGATIVE

## 2021-07-29 NOTE — ED Provider Notes (Addendum)
Emergency Medicine Provider Triage Evaluation Note  Debbie Cox , a 60 y.o. female  was evaluated in triage.  Pt complains of cough states that she is a resident of the homeless shelter currently and was told she needed to be tested for COVID.  States she is not short of breath.  Review of Systems  Positive: Cough Negative: Fever  Physical Exam  BP (!) 149/100 (BP Location: Right Arm)   Pulse 66   Temp 98.8 F (37.1 C) (Oral)   Resp 18   SpO2 99%  Gen:   Awake, no distress   Resp:  Normal effort  MSK:   Moves extremities without difficulty  Other:  Faint crackles in the right posterior lung field.  Otherwise good aeration.  Speaking full sentences, no tachypnea, well-appearing, in no acute distress.  Pleasant, able to answer questions.  Mill[place]rams.  Medical Decision Making  Medically screening exam initiated at 6:57 AM.  Appropriate orders placed.  Chisom Muntean was informed that the remainder of the evaluation will be completed by another provider, this initial triage assessment does not replace that evaluation, and the importance of remaining in the ED until their evaluation is complete.  Very well-appearing 60 year old female here for COVID test but noted that she does have shingles in right lower lung on auscultation.  Suspect some degree of atelectasis but will obtain chest x-ray.  Denies any chest pain or shortness of breath   Gailen Shelter, [place] 07/29/21 0658    Gailen Shelter, PA 07/29/21 5176    Margarita Grizzle, MD 07/29/21 1420

## 2021-07-29 NOTE — ED Notes (Signed)
Discharged by PA at triage. 

## 2021-07-29 NOTE — Discharge Instructions (Addendum)
Your COVID test was negative and chest x-ray was normal.   Viral Illness TREATMENT  Treatment is directed at relieving symptoms. There is no cure. Antibiotics are not effective, because the infection is caused by a virus, not by bacteria. Treatment may include:  Increased fluid intake. Sports drinks offer valuable electrolytes, sugars, and fluids.  Breathing heated mist or steam (vaporizer or shower).  Eating chicken soup or other clear broths, and maintaining good nutrition.  Getting plenty of rest.  Using gargles or lozenges for comfort.  Increasing usage of your inhaler if you have asthma.  Return to work when your temperature has returned to normal.  Gargle warm salt water and spit it out for sore throat. Take benadryl to decrease sinus secretions. Continue to alternate between Tylenol and ibuprofen for pain and fever control.  Follow Up: Follow up with your primary care doctor in 5-7 days for recheck of ongoing symptoms.  Return to emergency department for emergent changing or worsening of symptoms.

## 2021-07-29 NOTE — ED Triage Notes (Signed)
Pt arrived from ems from homeless shelter bc she has been having cough, congestion and body aches. Pt said she needed to be tested for center.

## 2021-07-29 NOTE — ED Provider Notes (Signed)
[facility] [place] EMERGENCY DEPARTMENT Provider Note   CSN: [id number] Arrival date & time: 07/29/21  1962     History Chief Complaint  Patient presents with   Cough   Nasal Congestion    Debbie Cox is a 60 y.o. female.  HPI Patient is a 60 year old female with a past medical history significant for homelessness currently at shelter, anxiety, bipolar, depression, alcohol use, hypertension.  She is presented to the ER today with complaint of nasal congestion and cough.  She states the cough is dry and she has some myalgias that seem to affecting her entire body she states they are mild she states she feels relatively well.  She denies any fevers chills nausea vomiting chest pain shortness of breath lightheadedness or dizziness.  No hemoptysis no chest pain and no leg swelling.  She states she is here because she needed to be tested for COVID before going back to the homeless shelter.  No other associate symptoms.  No aggravating mitigating factors.     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     Family History  Adopted: Yes    Social History   Tobacco Use   Smoking status: Every Day     Packs/day: 0.50    Years: 35.00    Pack years: 17.50    Types: Cigarettes   Smokeless tobacco: Never  Substance Use Topics   Alcohol use: Yes    Alcohol/week: 12.0 standard drinks    Types: 12 Glasses of wine per week   Drug use: No    Home Medications Prior to Admission medications   Medication Sig Start Date End Date Taking? Authorizing Provider  amLODipine (NORVASC) 10 MG tablet Take 1 tablet (10 mg total) by mouth daily. 06/29/21 08/28/21  Estella Husk, MD  cetirizine (ZYRTEC) 10 MG tablet Take 10 mg by mouth daily as needed for allergies. 05/22/21   [provider]  diclofenac Sodium (VOLTAREN) 1 % GEL Apply 1 application topically 2 (two) times daily as needed (please offer to pt in AM). Patient taking differently: Apply 1 application topically 2 (two) times daily as needed (For pain). 06/29/21 07/29/21  Estella Husk, MD  nicotine (NICODERM CQ - DOSED IN MG/24 HOURS) 14 mg/24hr patch Place 1 patch (14 mg total) onto the skin daily at 6 (six) AM. Patient taking differently: Place 14 mg onto the skin daily as needed (For smoking cessation.). 06/30/21 08/29/21  Estella Husk, MD  QUEtiapine (SEROQUEL) 100 MG tablet Take 1 tablet (100 mg total) by mouth at bedtime. 06/29/21 08/28/21  Estella Husk, MD  thiamine 100 MG tablet Take 1 tablet (100 mg  total) by mouth daily. 06/30/21 08/29/21  Estella Husk, MD    Allergies    Penicillins and Niacin and related  Review of Systems   Review of Systems  Constitutional:  Negative for chills and fever.  HENT:  Positive for congestion.   Eyes:  Negative for pain.  Respiratory:  Positive for cough. Negative for shortness of breath.   Cardiovascular:  Negative for chest pain and leg swelling.  Gastrointestinal:  Negative for abdominal pain and vomiting.  Genitourinary:  Negative for dysuria.  Musculoskeletal:  Positive for myalgias.  Skin:  Negative for rash.  Neurological:  Negative for dizziness and  headaches.   Physical Exam Updated Vital Signs BP (!) 149/100 (BP Location: Right Arm)   Pulse 66   Temp 98.8 F (37.1 C) (Oral)   Resp 18   SpO2 99%   Physical Exam Vitals and nursing note reviewed.  Constitutional:      General: She is not in acute distress. HENT:     Head: Normocephalic and atraumatic.     Nose: Nose normal.  Eyes:     General: No scleral icterus. Cardiovascular:     Rate and Rhythm: Normal rate and regular rhythm.     Pulses: Normal pulses.     Heart sounds: Normal heart sounds.  Pulmonary:     Effort: Pulmonary effort is normal. No respiratory distress.     Breath sounds: No wheezing.     Comments: On repeat examination no crackles no wheezing.  No tachypnea, speaking full sentences. Abdominal:     Palpations: Abdomen is soft.     Tenderness: There is no abdominal tenderness.  Musculoskeletal:     Cervical back: Normal range of motion.     Right lower leg: No edema.     Left lower leg: No edema.  Skin:    General: Skin is warm and dry.     Capillary Refill: Capillary refill takes less than 2 seconds.  Neurological:     Mental Status: She is alert. Mental status is at baseline.  Psychiatric:        Mood and Affect: Mood normal.        Behavior: Behavior normal.    ED Results / Procedures / Treatments   Labs (all labs ordered are listed, but only abnormal results are displayed) Labs Reviewed  RESP PANEL BY RT-PCR (FLU A&B, COVID) [id number]    EKG None  Radiology DG Chest 2 View  Result Date: 07/29/2021 CLINICAL DATA:  Cough.  COVID EXAM: CHEST - 2 VIEW COMPARISON:  12/12/2016 FINDINGS: Normal heart size and mediastinal contours. No acute infiltrate or edema. No effusion or pneumothorax. No acute osseous findings. IMPRESSION: Negative chest. Electronically Signed   By: Tiburcio Pea M.D.   On: 07/29/2021 07:21    Procedures Procedures   Medications Ordered in ED Medications - No data to display  ED Course  I have reviewed the  triage vital signs and the nursing notes.  Pertinent labs & imaging results that were available during my care of the patient were reviewed by me and considered in my medical decision making (see chart for details).    MDM Rules/Calculators/A&P                          Patient well-appearing 60 year old female some cough congestion myalgias tested negative for influenza and COVID chest x-ray unremarkable.  She is well-appearing on my examination not hypoxic return precautions given conservative therapy  recommended.  She will be discharged home.  All questions answered the best my ability.  Patient voiced understanding and agrees to return to ER should she experience any new emergent concerns otherwise will follow up with primary care  Final Clinical Impression(s) / ED Diagnoses Final diagnoses:  Acute cough    Rx / DC Orders ED Discharge Orders     None        Gailen Shelter, [place] 07/29/21 1048    Margarita Grizzle, MD 07/29/21 1524

## 2021-07-30 ENCOUNTER — Telehealth (HOSPITAL_COMMUNITY): Payer: Self-pay | Admitting: Family Medicine

## 2021-07-30 NOTE — BH Assessment (Signed)
Care Management - Follow Up [facility] Discharges   Writer attempted to make contact with patient today and was unsuccessful.  Writer was not able to leave a HIPPA compliant voice message because the patient voice mail box is full.   

## 2021-08-21 ENCOUNTER — Ambulatory Visit: Payer: Medicaid Other | Admitting: Rheumatology

## 2021-12-05 ENCOUNTER — Emergency Department (HOSPITAL_COMMUNITY): Payer: Medicaid Other

## 2021-12-05 ENCOUNTER — Other Ambulatory Visit: Payer: Self-pay

## 2021-12-05 ENCOUNTER — Emergency Department (HOSPITAL_COMMUNITY)
Admission: EM | Admit: 2021-12-05 | Discharge: 2021-12-05 | Disposition: A | Discharge status: Home / Self Care | Payer: Medicaid Other | Attending: Emergency Medicine | Admitting: Emergency Medicine

## 2021-12-05 DIAGNOSIS — Z79899 Other long term (current) drug therapy: Secondary | ICD-10-CM | POA: Insufficient documentation

## 2021-12-05 DIAGNOSIS — S300XXA Contusion of lower back and pelvis, initial encounter: Secondary | ICD-10-CM | POA: Diagnosis not present

## 2021-12-05 DIAGNOSIS — S5002XA Contusion of left elbow, initial encounter: Secondary | ICD-10-CM | POA: Diagnosis not present

## 2021-12-05 DIAGNOSIS — S59902A Unspecified injury of left elbow, initial encounter: Secondary | ICD-10-CM | POA: Diagnosis present

## 2021-12-05 DIAGNOSIS — S36892A Contusion of other intra-abdominal organs, initial encounter: Secondary | ICD-10-CM | POA: Insufficient documentation

## 2021-12-05 DIAGNOSIS — S301XXA Contusion of abdominal wall, initial encounter: Secondary | ICD-10-CM
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
## 2022-02-06 ENCOUNTER — Other Ambulatory Visit: Payer: Self-pay | Admitting: Family Medicine

## 2022-02-06 ENCOUNTER — Ambulatory Visit
Admission: RE | Admit: 2022-02-06 | Discharge: 2022-02-06 | Disposition: A | Discharge status: Home / Self Care | Payer: Medicaid Other | Source: Ambulatory Visit | Attending: Family Medicine | Admitting: Family Medicine

## 2022-02-06 DIAGNOSIS — M545 Low back pain, unspecified: Secondary | ICD-10-CM

## 2022-08-12 ENCOUNTER — Emergency Department (HOSPITAL_COMMUNITY)
Admission: EM | Admit: 2022-08-12 | Discharge: 2022-08-12 | Disposition: A | Discharge status: Home / Self Care | Attending: Emergency Medicine | Admitting: Emergency Medicine

## 2022-08-12 ENCOUNTER — Emergency Department (HOSPITAL_COMMUNITY)

## 2022-08-12 ENCOUNTER — Encounter (HOSPITAL_COMMUNITY): Payer: Self-pay

## 2022-08-12 DIAGNOSIS — I10 Essential (primary) hypertension: Secondary | ICD-10-CM | POA: Diagnosis not present

## 2022-08-12 DIAGNOSIS — M25522 Pain in left elbow: Secondary | ICD-10-CM | POA: Insufficient documentation

## 2022-08-12 DIAGNOSIS — S1091XA Abrasion of unspecified part of neck, initial encounter: Secondary | ICD-10-CM | POA: Diagnosis not present

## 2022-08-12 DIAGNOSIS — S169XXA Unspecified injury of muscle, fascia and tendon at neck level, initial encounter: Secondary | ICD-10-CM | POA: Diagnosis present

## 2022-08-12 DIAGNOSIS — Z79899 Other long term (current) drug therapy: Secondary | ICD-10-CM | POA: Diagnosis not present

## 2022-08-12 MED ORDER — AMLODIPINE BESYLATE 5 MG PO TABS
10.0000 mg | ORAL_TABLET | Freq: Once | ORAL | Status: AC
  Administered 2022-08-12: 10 mg via ORAL
  Filled 2022-08-12: qty 2
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
## 2022-08-16 ENCOUNTER — Other Ambulatory Visit: Payer: Self-pay | Admitting: Family

## 2022-08-16 DIAGNOSIS — Z1231 Encounter for screening mammogram for malignant neoplasm of breast: Secondary | ICD-10-CM

## 2022-09-03 ENCOUNTER — Ambulatory Visit

## 2022-10-16 ENCOUNTER — Ambulatory Visit (INDEPENDENT_AMBULATORY_CARE_PROVIDER_SITE_OTHER): Payer: Medicaid Other | Admitting: Podiatry

## 2022-10-16 VITALS — BP 134/82

## 2022-10-16 DIAGNOSIS — B351 Tinea unguium: Secondary | ICD-10-CM

## 2022-10-16 DIAGNOSIS — Z79899 Other long term (current) drug therapy: Secondary | ICD-10-CM

## 2022-10-16 NOTE — Progress Notes (Signed)
Subjective:  Patient ID: Debbie Cox, female    DOB: [birth year],  MRN: [id number]  Chief Complaint  Patient presents with   Nail Problem    62 y.o. female presents with the above complaint.  Patient presents with thickened elongated dystrophic toenails x 10 mild pain on palpation.  Patient states they have been present for quite some time.  She would like to discuss treatment options for this.  She has not seen MRIs prior to seeing me.  She is tried some over-the-counter medication which has not helped.   Review of Systems: Negative except as noted in the HPI. Denies N/V/F/Ch.  Past Medical History:  Diagnosis Date   Anxiety    Bipolar disorder ([place])    Depression    ETOH abuse    Hypertension    Joint pain    hands    Current Outpatient Medications:    amLODipine (NORVASC) 10 MG tablet, Take 1 tablet (10 mg total) by mouth daily., Disp: 30 tablet, Rfl: 1   cetirizine (ZYRTEC) 10 MG tablet, Take 10 mg by mouth daily as needed for allergies., Disp: , Rfl:    QUEtiapine (SEROQUEL) 100 MG tablet, Take 1 tablet (100 mg total) by mouth at bedtime., Disp: 30 tablet, Rfl: 1  Social History   Tobacco Use  Smoking Status Every Day   Packs/day: 0.50   Years: 35.00   Total pack years: 17.50   Types: Cigarettes  Smokeless Tobacco Never    Allergies  Allergen Reactions   Penicillins Anaphylaxis, Swelling and Other (See Comments)    Tongue swelled and couldn't breathe Has patient had a PCN reaction causing immediate rash, facial/tongue/throat swelling, SOB or lightheadedness with hypotension: Yes Has patient had a PCN reaction causing severe rash involving mucus membranes or skin necrosis: No Has patient had a PCN reaction that required hospitalization No Has patient had a PCN reaction occurring within the last 10 years: Yes If all of the above answers are "NO", then may proceed with Cephalosporin use.    Niacin And Related Hives and Other (See Comments)    REACTION NOT AN  ALLERGY > FLUSHING EXPECTED WITH NIACIN    Objective:   Vitals:   10/16/22 1424  BP: 134/82   There is no height or weight on file to calculate BMI. Constitutional Well developed. Well nourished.  Vascular Dorsalis pedis pulses palpable bilaterally. Posterior tibial pulses palpable bilaterally. Capillary refill normal to all digits.  No cyanosis or clubbing noted. Pedal hair growth normal.  Neurologic Normal speech. Oriented to person, place, and time. Epicritic sensation to light touch grossly present bilaterally.  Dermatologic Nails thickened elongated dystrophic mycotic toenails x 10 mild pain on palpation Skin within normal limits  Orthopedic: Normal joint ROM without pain or crepitus bilaterally. No visible deformities. No bony tenderness.   Radiographs: None Assessment:   1. Long-term use of high-risk medication   2. Nail fungus   3. Onychomycosis due to dermatophyte    Plan:  Patient was evaluated and treated and all questions answered.  Toenails x 10 onychomycosis/nail fungus -Educated the patient on the etiology of onychomycosis and various treatment options associated with improving the fungal load.  I explained to the patient that there is 3 treatment options available to treat the onychomycosis including topical, p.o., laser treatment.  Patient elected to undergo p.o. options with Lamisil/terbinafine therapy.  In order for me to start the medication therapy, I explained to the patient the importance of evaluating the liver and obtaining  the liver function test.  Once the liver function test comes back normal I will start him on 25-month course of Lamisil therapy.  Patient understood all risk and would like to proceed with Lamisil therapy.  I have asked the patient to immediately stop the Lamisil therapy if she has any reactions to it and call the office or go to the emergency room right away.  Patient states understanding   Return in about 3 months (around 01/15/2023)  for [place].

## 2023-01-29 ENCOUNTER — Ambulatory Visit: Payer: Medicaid Other | Admitting: Podiatry

## 2024-02-05 IMAGING — CR DG LUMBAR SPINE COMPLETE 4+V
5 series · 5 of 5 positions shown · non-contrast
Comparison: X-ray 07/25/2021.

CLINICAL DATA: Low back pain radiating down both legs x 2 years.

EXAM:
LUMBAR SPINE - COMPLETE 5 VIEW done in the upright position

[w lumbar spine ap]
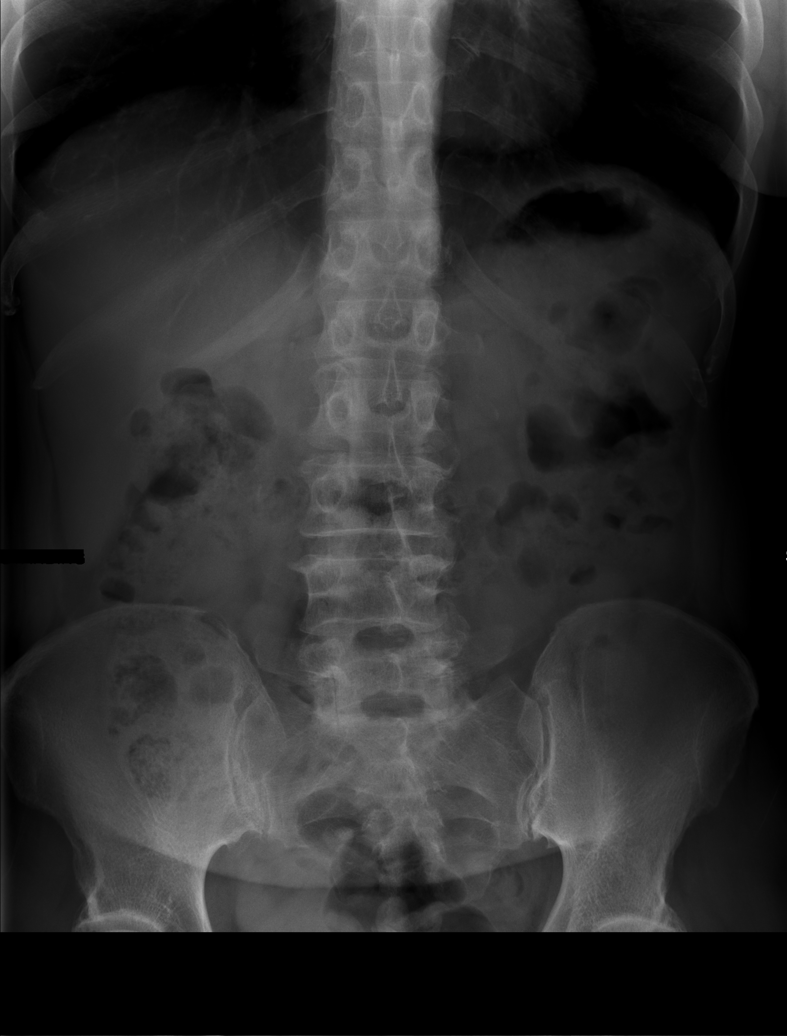

[w lumbar spine obl (1 of 2)]
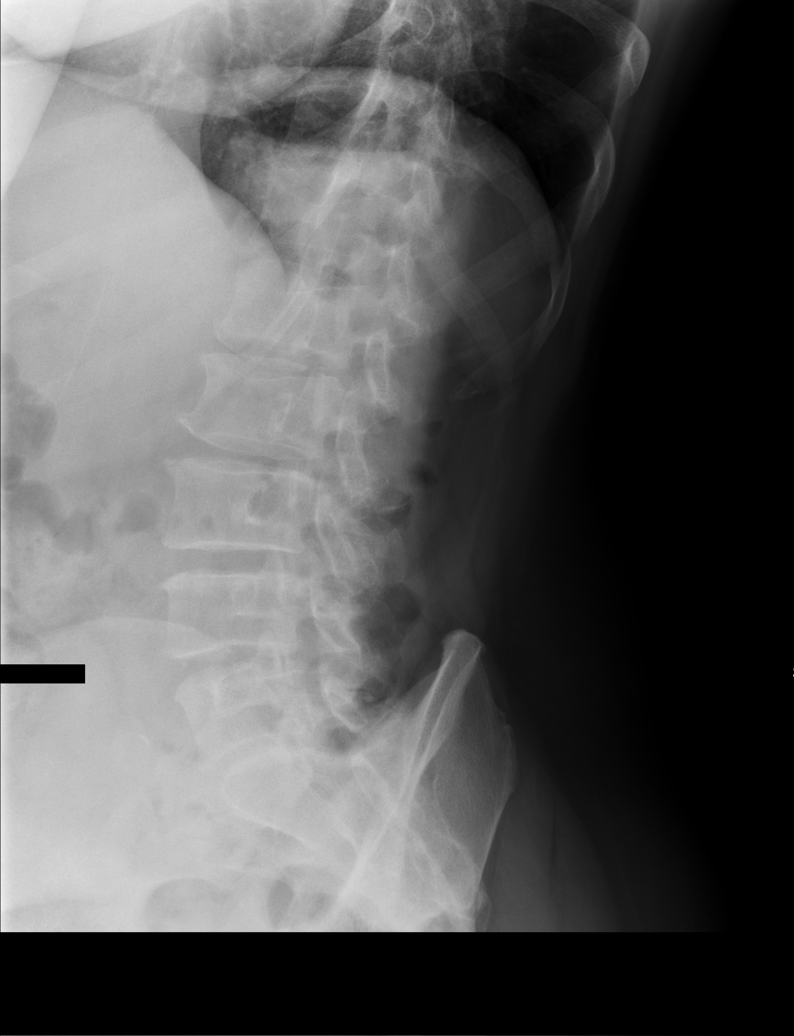

[w lumbar spine obl (2 of 2)]
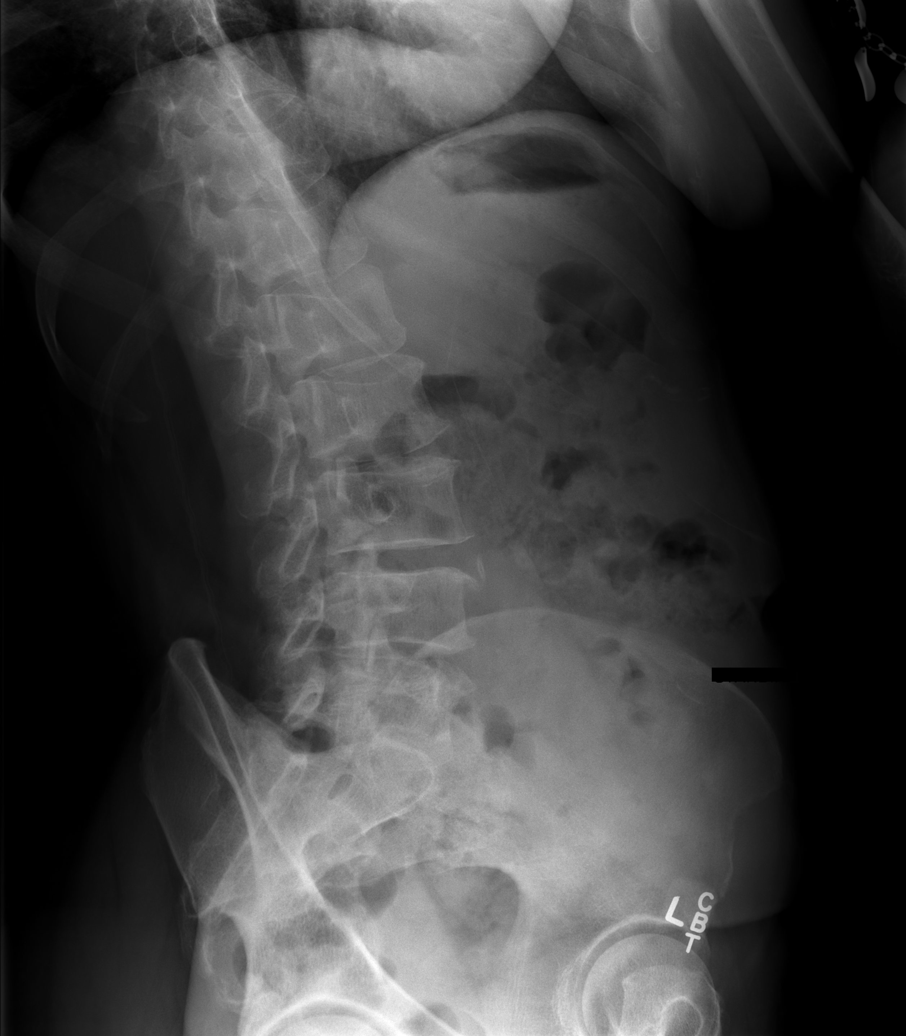

[w lumbar spine lat]
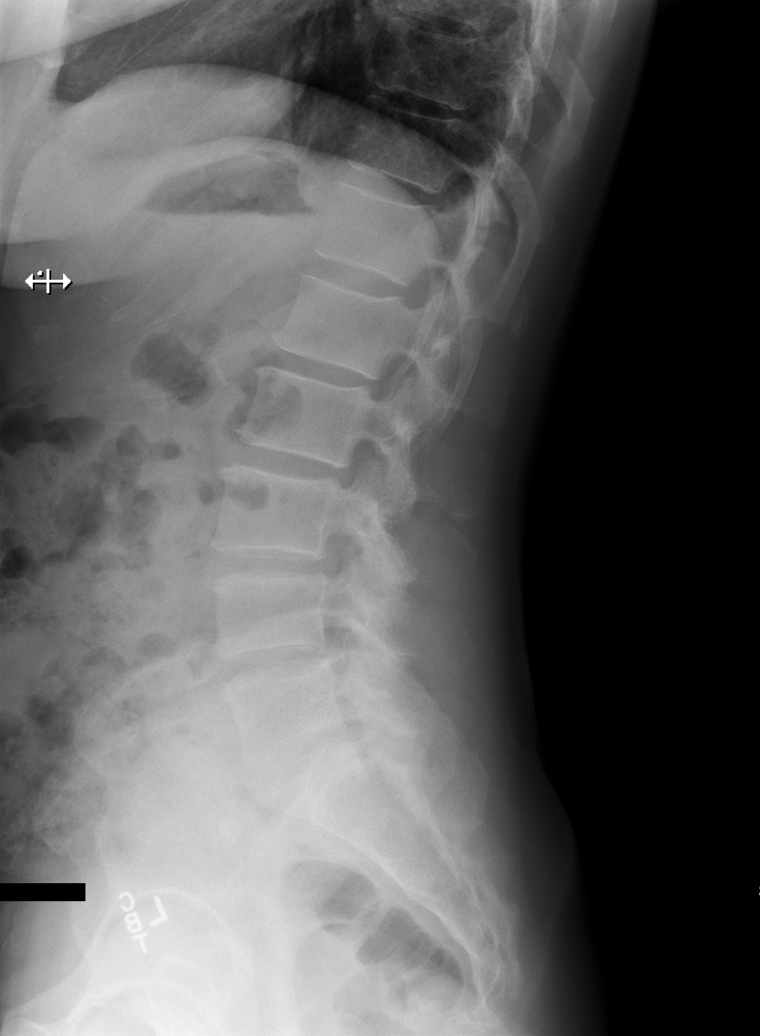

[w lumbar l-5 s-1 spot]
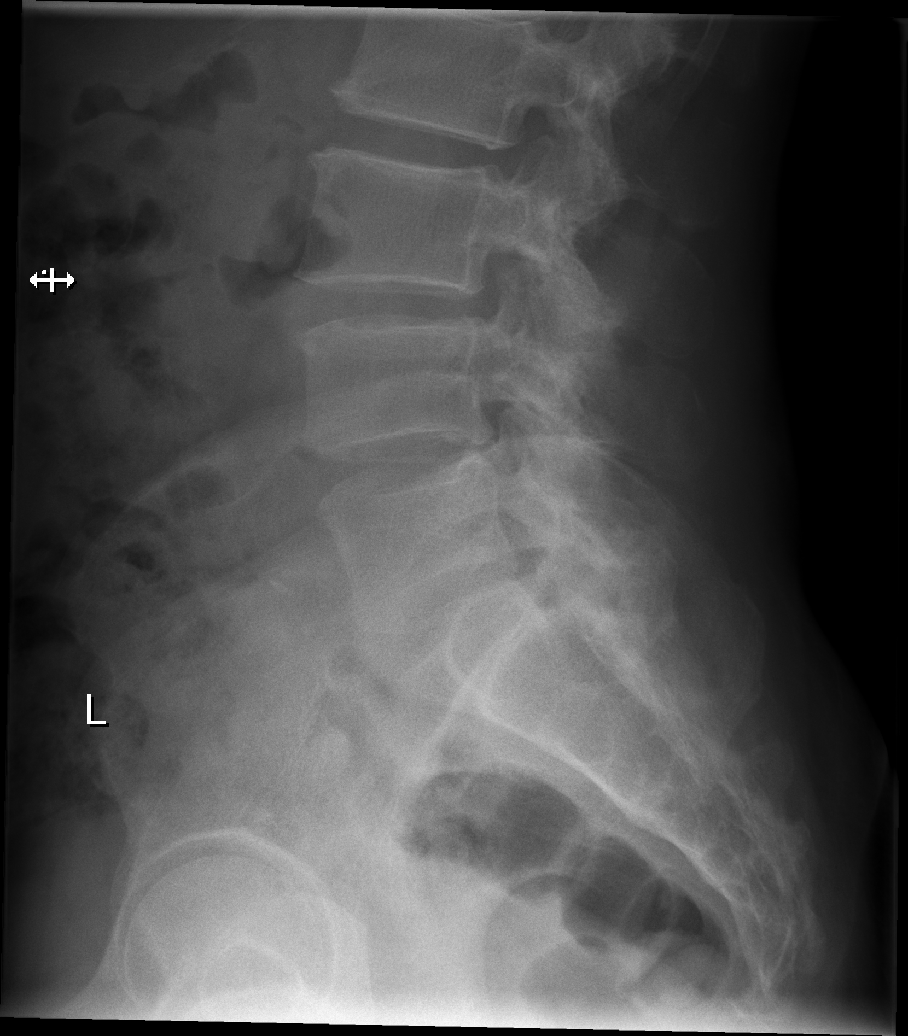

[5 of 5 positions shown; findings below may reference images not displayed]

FINDINGS: No recent fracture is seen. Alignment of posterior margins of
vertebral bodies is unremarkable. Degenerative changes are noted in
facet joints. Small anterior bony spurs are noted at multiple
levels. Overall, no significant interval changes are noted.
IMPRESSION: No recent fracture is seen. Degenerative changes are noted with
facet hypertrophy and small anterior bony spurs. No significant
interval changes are noted.

## 2024-02-13 ENCOUNTER — Emergency Department (HOSPITAL_COMMUNITY): Payer: MEDICAID

## 2024-02-13 ENCOUNTER — Encounter (HOSPITAL_COMMUNITY): Payer: Self-pay | Admitting: Emergency Medicine

## 2024-02-13 ENCOUNTER — Other Ambulatory Visit: Payer: Self-pay

## 2024-02-13 ENCOUNTER — Emergency Department (HOSPITAL_COMMUNITY)
Admission: EM | Admit: 2024-02-13 | Discharge: 2024-02-13 | Disposition: A | Discharge status: Home / Self Care | Payer: MEDICAID | Attending: Emergency Medicine | Admitting: Emergency Medicine

## 2024-02-13 DIAGNOSIS — M791 Myalgia, unspecified site: Secondary | ICD-10-CM | POA: Insufficient documentation

## 2024-02-13 DIAGNOSIS — R0789 Other chest pain: Secondary | ICD-10-CM | POA: Diagnosis not present

## 2024-02-13 DIAGNOSIS — M25522 Pain in left elbow: Secondary | ICD-10-CM | POA: Diagnosis not present

## 2024-02-13 DIAGNOSIS — M79675 Pain in left toe(s): Secondary | ICD-10-CM | POA: Diagnosis not present

## 2024-02-13 DIAGNOSIS — Y92410 Unspecified street and highway as the place of occurrence of the external cause: Secondary | ICD-10-CM | POA: Diagnosis not present

## 2024-02-13 DIAGNOSIS — M545 Low back pain, unspecified: Secondary | ICD-10-CM | POA: Insufficient documentation

## 2024-02-13 DIAGNOSIS — M546 Pain in thoracic spine: Secondary | ICD-10-CM | POA: Diagnosis present

## 2024-02-13 DIAGNOSIS — M7918 Myalgia, other site: Secondary | ICD-10-CM

## 2024-02-13 MED ORDER — IBUPROFEN 600 MG PO TABS: 600.0000 mg | ORAL_TABLET | Freq: Four times a day (QID) | ORAL | 0 refills | Status: AC | PRN

## 2024-02-13 MED ORDER — IBUPROFEN 200 MG PO TABS
600.0000 mg | ORAL_TABLET | Freq: Once | ORAL | Status: AC
  Administered 2024-02-13: 600 mg via ORAL
  Filled 2024-02-13: qty 3

## 2024-02-13 MED ORDER — METHOCARBAMOL 750 MG PO TABS: 750.0000 mg | ORAL_TABLET | Freq: Three times a day (TID) | ORAL | 0 refills | Status: AC

## 2024-02-13 NOTE — ED Notes (Signed)
 Patient transported to X-ray
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
## 2024-02-13 NOTE — Discharge Instructions (Addendum)
 As we discussed your xrays are all negative for any broken bones or serious injury. You can expect to be more sore tomorrow. You have been prescribed medications to help with the soreness.   Follow up with your doctor for recheck in 3-4 days if no better.
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
## 2024-02-17 ENCOUNTER — Other Ambulatory Visit: Payer: Self-pay

## 2024-02-17 ENCOUNTER — Encounter (HOSPITAL_COMMUNITY): Payer: Self-pay

## 2024-02-17 ENCOUNTER — Emergency Department (HOSPITAL_COMMUNITY)
Admission: EM | Admit: 2024-02-17 | Discharge: 2024-02-17 | Disposition: A | Discharge status: Home / Self Care | Payer: MEDICAID | Attending: Emergency Medicine | Admitting: Emergency Medicine

## 2024-02-17 DIAGNOSIS — M545 Low back pain, unspecified: Secondary | ICD-10-CM | POA: Insufficient documentation

## 2024-02-17 DIAGNOSIS — I10 Essential (primary) hypertension: Secondary | ICD-10-CM | POA: Diagnosis not present

## 2024-02-17 DIAGNOSIS — Z79899 Other long term (current) drug therapy: Secondary | ICD-10-CM | POA: Insufficient documentation

## 2024-02-17 DIAGNOSIS — Y92410 Unspecified street and highway as the place of occurrence of the external cause: Secondary | ICD-10-CM | POA: Insufficient documentation

## 2024-02-17 MED ORDER — OXYCODONE-ACETAMINOPHEN 5-325 MG PO TABS
1.0000 | ORAL_TABLET | Freq: Once | ORAL | Status: AC
  Administered 2024-02-17: 1 via ORAL
  Filled 2024-02-17: qty 1

## 2024-02-17 MED ORDER — LIDOCAINE 5 % EX PTCH
1.0000 | MEDICATED_PATCH | CUTANEOUS | Status: DC
  Administered 2024-02-17: 1 via TRANSDERMAL
  Filled 2024-02-17: qty 1

## 2024-02-17 MED ORDER — LIDOCAINE 5 % EX PTCH: 1.0000 | MEDICATED_PATCH | CUTANEOUS | 0 refills | Status: AC
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
## 2024-07-06 ENCOUNTER — Other Ambulatory Visit: Payer: Self-pay

## 2024-07-06 ENCOUNTER — Ambulatory Visit: Payer: MEDICAID | Admitting: Physician Assistant

## 2024-07-06 ENCOUNTER — Encounter: Payer: Self-pay | Admitting: Physician Assistant

## 2024-07-06 VITALS — BP 180/106 | HR 72 | Ht 66.0 in | Wt 127.0 lb

## 2024-07-06 DIAGNOSIS — R051 Acute cough: Secondary | ICD-10-CM

## 2024-07-06 DIAGNOSIS — I160 Hypertensive urgency: Secondary | ICD-10-CM

## 2024-07-06 DIAGNOSIS — I10 Essential (primary) hypertension: Secondary | ICD-10-CM | POA: Diagnosis not present

## 2024-07-06 DIAGNOSIS — F17210 Nicotine dependence, cigarettes, uncomplicated: Secondary | ICD-10-CM

## 2024-07-06 DIAGNOSIS — Z79899 Other long term (current) drug therapy: Secondary | ICD-10-CM

## 2024-07-06 DIAGNOSIS — J302 Other seasonal allergic rhinitis: Secondary | ICD-10-CM | POA: Diagnosis not present

## 2024-07-06 MED ORDER — AMLODIPINE BESYLATE 10 MG PO TABS
10.0000 mg | ORAL_TABLET | Freq: Every day | ORAL | 1 refills | Status: DC
  Filled 2024-07-06: qty 30, 30d supply, fill #0
  Filled 2024-08-11: qty 30, 30d supply, fill #1

## 2024-07-06 MED ORDER — CLONIDINE HCL 0.1 MG PO TABS
0.1000 mg | ORAL_TABLET | Freq: Once | ORAL | Status: AC
  Administered 2024-07-06: 0.1 mg via ORAL

## 2024-07-06 MED ORDER — CETIRIZINE HCL 10 MG PO TABS
10.0000 mg | ORAL_TABLET | Freq: Every day | ORAL | 1 refills | Status: AC | PRN
  Filled 2024-07-06: qty 30, 30d supply, fill #0

## 2024-07-06 MED ORDER — BENZONATATE 100 MG PO CAPS
200.0000 mg | ORAL_CAPSULE | Freq: Three times a day (TID) | ORAL | 0 refills | Status: DC
  Filled 2024-07-06: qty 20, 4d supply, fill #0

## 2024-07-06 NOTE — Patient Instructions (Addendum)
 VISIT SUMMARY:  During your visit, we discussed your elevated blood pressure, cough with mucus, kidney function, and cholesterol levels. We addressed your concerns and created a plan to manage each issue.  YOUR PLAN:  -ESSENTIAL HYPERTENSION: Your blood pressure is currently high because you have not been taking your medication and are experiencing stress. We gave you clonidine  to lower your blood pressure immediately and will resume your regular medication, amlodipine   -COUGH WITH MUCUS: Your cough with mucus is likely due to seasonal changes. We prescribed Zyrtec  to help with your symptoms and provided a cough medication that is safe to use with your blood pressure treatment.  -DECREASED KIDNEY FUNCTION: You have had decreased kidney function in the past. We will schedule blood work to check your kidney function.  -HYPERLIPIDEMIA: You have had high cholesterol levels before. We will schedule fasting blood work to reassess your cholesterol levels.  How to Take Your Blood Pressure Blood pressure is a measurement of how strongly your blood is pressing against the walls of your arteries. Arteries are blood vessels that carry blood from your heart throughout your body. Your health care provider takes your blood pressure at each office visit. You can also take your own blood pressure at home with a blood pressure monitor. You may need to take your own blood pressure to: Confirm a diagnosis of high blood pressure (hypertension). Monitor your blood pressure over time. Make sure your blood pressure medicine is working. Supplies needed: Blood pressure monitor. A chair to sit in. This should be a chair where you can sit upright with your back supported. Do not sit on a soft couch or an armchair. Table or desk. Small notebook and pencil or pen. How to prepare To get the most accurate reading, avoid the following for 30 minutes before you check your blood pressure: Drinking caffeine. Drinking  alcohol. Eating. Smoking. Exercising. Five minutes before you check your blood pressure: Use the bathroom and urinate so that you have an empty bladder. Sit quietly in a chair. Do not talk. How to take your blood pressure To check your blood pressure, follow the instructions in the manual that came with your blood pressure monitor. If you have a digital blood pressure monitor, the instructions may be as follows: Sit up straight in a chair. Place your feet on the floor. Do not cross your ankles or legs. Rest your left arm at the level of your heart on a table or desk or on the arm of a chair. Pull up your shirt sleeve. Wrap the blood pressure cuff around the upper part of your left arm, 1 inch (2.5 cm) above your elbow. It is best to wrap the cuff around bare skin. Fit the cuff snugly, but not too tightly, around your arm. You should be able to place only one finger between the cuff and your arm. Position the cord so that it rests in the bend of your elbow. Press the power button. Sit quietly while the cuff inflates and deflates. Read the digital reading on the monitor screen and write the numbers down (record them) in a notebook. Wait 2-3 minutes, then repeat the steps, starting at step 1. What does my blood pressure reading mean? A blood pressure reading consists of a higher number over a lower number. Ideally, your blood pressure should be below 120/80. The first (top) number is called the systolic pressure. It is a measure of the pressure in your arteries as your heart beats. The second (bottom) number is  called the diastolic pressure. It is a measure of the pressure in your arteries as the heart relaxes. Blood pressure is classified into four stages. The following are the stages for adults who do not have a short-term serious illness or a chronic condition. Systolic pressure and diastolic pressure are measured in a unit called mm Hg (millimeters of mercury).  Normal Systolic pressure:  below 120. Diastolic pressure: below 80. Elevated Systolic pressure: 120-129. Diastolic pressure: below 80. Hypertension stage 1 Systolic pressure: 130-139. Diastolic pressure: 80-89. Hypertension stage 2 Systolic pressure: 140 or above. Diastolic pressure: 90 or above. You can have elevated blood pressure or hypertension even if only the systolic or only the diastolic number in your reading is higher than normal. Follow these instructions at home: Medicines Take over-the-counter and prescription medicines only as told by your health care provider. Tell your health care provider if you are having any side effects from blood pressure medicine. General instructions Check your blood pressure as often as recommended by your health care provider. Check your blood pressure at the same time every day. Take your monitor to the next appointment with your health care provider to make sure that: You are using it correctly. It provides accurate readings. Understand what your goal blood pressure numbers are. Keep all follow-up visits. This is important. General tips Your health care provider can suggest a reliable monitor that will meet your needs. There are several types of home blood pressure monitors. Choose a monitor that has an arm cuff. Do not choose a monitor that measures your blood pressure from your wrist or finger. Choose a cuff that wraps snugly, not too tight or too loose, around your upper arm. You should be able to fit only one finger between your arm and the cuff. You can buy a blood pressure monitor at most drugstores or online. Where to find more information American Heart Association: www.heart.org Contact a health care provider if: Your blood pressure is consistently high. Your blood pressure is suddenly low. Get help right away if: Your systolic blood pressure is higher than 180. Your diastolic blood pressure is higher than 120. These symptoms may be an emergency. Get  help right away. Call 911. Do not wait to see if the symptoms will go away. Do not drive yourself to the hospital. Summary Blood pressure is a measurement of how strongly your blood is pressing against the walls of your arteries. A blood pressure reading consists of a higher number over a lower number. Ideally, your blood pressure should be below 120/80. Check your blood pressure at the same time every day. Avoid caffeine, alcohol, smoking, and exercise for 30 minutes prior to checking your blood pressure. These agents can affect the accuracy of the blood pressure reading. This information is not intended to replace advice given to you by your health care provider. Make sure you discuss any questions you have with your health care provider. Document Revised: 06/14/2021 Document Reviewed: 06/14/2021 Elsevier Patient Education  2024 [employer].
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
## 2024-07-07 ENCOUNTER — Encounter: Payer: Self-pay | Admitting: Physician Assistant

## 2024-07-07 ENCOUNTER — Other Ambulatory Visit: Payer: Self-pay | Admitting: Physician Assistant

## 2024-07-07 ENCOUNTER — Other Ambulatory Visit: Payer: Self-pay

## 2024-07-07 DIAGNOSIS — I10 Essential (primary) hypertension: Secondary | ICD-10-CM

## 2024-07-07 NOTE — Progress Notes (Signed)
 Pt seen because she was worried about her BP.  Currently sleeping on the ground, hopes to get into the shelter soon.  Cari Mayers, PAC saw her yesterday and refilled her amlodipine , cetirizine  and gave her tessalon  perles for cough. She is not able to pick them up, no transportation.   She went back for a BP check today. We rechecked here as well.     07/07/2024    3:02 PM 07/07/2024   11:39 AM 07/06/2024    4:18 PM  Vitals with BMI  Systolic 142 130 819  Diastolic 87 85 106  Pulse 82 81     Pharmacy contacted, we will pick up her meds tomorrow.  Shona Shad, PA-C 07/07/2024 3:06 PM

## 2024-07-07 NOTE — Progress Notes (Signed)
 error

## 2024-07-08 ENCOUNTER — Encounter: Payer: Self-pay | Admitting: Physician Assistant

## 2024-07-13 NOTE — Congregational Nurse Program (Signed)
  Dept: [phone]   Congregational Nurse Program Note  Date of Encounter: 07/12/2024  Follow-up visit from starting on medicines for elevated blood pressure.  BP 133/89, states she has taken medicines since start last Thursday.  Will recheck BP in AM.    Referral to [facility] for dental issues. Past Medical History: Past Medical History:  Diagnosis Date   Anxiety    Bipolar disorder (HCC)    Depression    ETOH abuse    Hypertension    Joint pain    hands    Encounter Details:  Community Questionnaire - 07/12/24 1326       Questionnaire   Ask client: Do you give verbal consent for me to treat you today? Yes    Student Assistance N/A    Location Patient Served  [facility]    Encounter Setting CN site    Population Status Unhoused    Insurance Medicaid    Insurance/Financial Assistance Referral [facility]    Medication Have Medication Insecurities;Patient Medications Reviewed    Medical Provider Yes    Screening Referrals Made Annual Wellness Visit;Dental    Medical Referrals Made Dental    Medical Appointment Completed Non-[facility] PCP/Clinic    CNP Interventions Advocate/Support;Counsel;Spiritual Care;Navigate Healthcare System;Case Management    Screenings CN Performed Blood Pressure    ED Visit Averted N/A    Life-Saving Intervention Made N/A

## 2024-07-13 NOTE — Congregational Nurse Program (Signed)
  Dept: [phone]   Congregational Nurse Program Note  Date of Encounter: 07/13/2024  Clinic visit to recheck blood pressure, BP 126/82, pulse 81 and regular.  States she has taken medicines as prescribed, discussed normal blood pressure levels.  Complains of lower back pain continues, to see [facility] clinic MD on 07/14/2024. Past Medical History: Past Medical History:  Diagnosis Date   Anxiety    Bipolar disorder (HCC)    Depression    ETOH abuse    Hypertension    Joint pain    hands    Encounter Details:  Community Questionnaire - 07/13/24 1000       Questionnaire   Ask client: Do you give verbal consent for me to treat you today? Yes    Student Assistance N/A    Location Patient Served  [facility]    Encounter Setting CN site    Population Status Unhoused    Insurance Medicaid    Insurance/Financial Assistance Referral N/A    Medication Have Medication Insecurities;Patient Medications Reviewed    Medical Provider Yes    Screening Referrals Made N/A    Medical Referrals Made N/A    Medical Appointment Completed N/A    CNP Interventions Advocate/Support;Counsel;Navigate Healthcare System;Case Management;Educate    Screenings CN Performed Blood Pressure    ED Visit Averted N/A    Life-Saving Intervention Made N/A

## 2024-07-14 ENCOUNTER — Other Ambulatory Visit: Payer: Self-pay | Admitting: Physician Assistant

## 2024-07-14 ENCOUNTER — Other Ambulatory Visit (HOSPITAL_COMMUNITY): Payer: Self-pay

## 2024-07-14 ENCOUNTER — Encounter: Payer: Self-pay | Admitting: Critical Care Medicine

## 2024-07-14 ENCOUNTER — Encounter: Payer: Self-pay | Admitting: Physician Assistant

## 2024-07-14 ENCOUNTER — Other Ambulatory Visit: Payer: Self-pay | Admitting: Critical Care Medicine

## 2024-07-14 ENCOUNTER — Other Ambulatory Visit: Payer: Self-pay

## 2024-07-14 MED ORDER — VALSARTAN 40 MG PO TABS
40.0000 mg | ORAL_TABLET | Freq: Every day | ORAL | 3 refills | Status: DC
  Filled 2024-07-14 (×2): qty 30, 30d supply, fill #0
  Filled 2024-08-11: qty 30, 30d supply, fill #1
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
## 2024-07-14 NOTE — Progress Notes (Unsigned)
 Sending patient referral to behavioral health   [facility] [address], [place], [place] [address] [phone] or [phone] Walk-in urgent care 24/7 for anyone  For [facility] ONLY New patient assessments and therapy walk-ins: Monday and Wednesday 8am-11am First and second Friday 1pm-5pm New patient psychiatry and medication management walk-ins: Mondays, Wednesdays, Thursdays, Fridays 8am-11am No psychiatry walk-ins on Tuesday    Made referral

## 2024-07-14 NOTE — Progress Notes (Signed)
Update chart

## 2024-07-15 ENCOUNTER — Other Ambulatory Visit: Payer: Self-pay

## 2024-07-15 ENCOUNTER — Ambulatory Visit (INDEPENDENT_AMBULATORY_CARE_PROVIDER_SITE_OTHER): Payer: MEDICAID | Admitting: Mental Health

## 2024-07-15 ENCOUNTER — Other Ambulatory Visit (HOSPITAL_COMMUNITY): Payer: Self-pay

## 2024-07-15 ENCOUNTER — Other Ambulatory Visit: Payer: Self-pay | Admitting: Critical Care Medicine

## 2024-07-15 DIAGNOSIS — F333 Major depressive disorder, recurrent, severe with psychotic symptoms: Secondary | ICD-10-CM

## 2024-07-15 DIAGNOSIS — F4310 Post-traumatic stress disorder, unspecified: Secondary | ICD-10-CM | POA: Diagnosis not present

## 2024-07-15 DIAGNOSIS — K08109 Complete loss of teeth, unspecified cause, unspecified class: Secondary | ICD-10-CM

## 2024-07-15 DIAGNOSIS — F1020 Alcohol dependence, uncomplicated: Secondary | ICD-10-CM

## 2024-07-15 NOTE — Progress Notes (Signed)
 Dentures referral
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
## 2024-07-19 ENCOUNTER — Telehealth: Payer: Self-pay | Admitting: Physician Assistant

## 2024-07-19 NOTE — Telephone Encounter (Signed)
 Contacted pt to schedule follow up visit on [facility]. No answer/LVM

## 2024-07-25 ENCOUNTER — Emergency Department (HOSPITAL_COMMUNITY): Payer: MEDICAID

## 2024-07-25 ENCOUNTER — Other Ambulatory Visit: Payer: Self-pay

## 2024-07-25 ENCOUNTER — Emergency Department (HOSPITAL_COMMUNITY)
Admission: EM | Admit: 2024-07-25 | Discharge: 2024-07-25 | Disposition: A | Discharge status: Home / Self Care | Payer: MEDICAID | Attending: Emergency Medicine | Admitting: Emergency Medicine

## 2024-07-25 DIAGNOSIS — M62838 Other muscle spasm: Secondary | ICD-10-CM

## 2024-07-25 DIAGNOSIS — I10 Essential (primary) hypertension: Secondary | ICD-10-CM | POA: Insufficient documentation

## 2024-07-25 DIAGNOSIS — J189 Pneumonia, unspecified organism: Secondary | ICD-10-CM

## 2024-07-25 DIAGNOSIS — M542 Cervicalgia: Secondary | ICD-10-CM | POA: Diagnosis not present

## 2024-07-25 DIAGNOSIS — Z87828 Personal history of other (healed) physical injury and trauma: Secondary | ICD-10-CM

## 2024-07-25 DIAGNOSIS — M546 Pain in thoracic spine: Secondary | ICD-10-CM | POA: Insufficient documentation

## 2024-07-25 DIAGNOSIS — J168 Pneumonia due to other specified infectious organisms: Secondary | ICD-10-CM | POA: Diagnosis not present

## 2024-07-25 DIAGNOSIS — M545 Low back pain, unspecified: Secondary | ICD-10-CM | POA: Diagnosis present

## 2024-07-25 DIAGNOSIS — Z79899 Other long term (current) drug therapy: Secondary | ICD-10-CM | POA: Diagnosis not present

## 2024-07-25 DIAGNOSIS — F1090 Alcohol use, unspecified, uncomplicated: Secondary | ICD-10-CM

## 2024-07-25 DIAGNOSIS — Z7982 Long term (current) use of aspirin: Secondary | ICD-10-CM | POA: Diagnosis not present

## 2024-07-25 DIAGNOSIS — M549 Dorsalgia, unspecified: Secondary | ICD-10-CM

## 2024-07-25 LAB — CBC WITH DIFFERENTIAL/PLATELET
Abs Immature Granulocytes: 0.05 K/uL (ref 0.00–0.07)
Basophils Absolute: 0 K/uL (ref 0.0–0.1)
Basophils Relative: 0 %
Eosinophils Absolute: 0 K/uL (ref 0.0–0.5)
Eosinophils Relative: 0 %
HCT: 35.8 % — ABNORMAL LOW (ref 36.0–46.0)
Hemoglobin: 11.3 g/dL — ABNORMAL LOW (ref 12.0–15.0)
Immature Granulocytes: 0 %
Lymphocytes Relative: 6 %
Lymphs Abs: 0.8 K/uL (ref 0.7–4.0)
MCH: 25 pg — ABNORMAL LOW (ref 26.0–34.0)
MCHC: 31.6 g/dL (ref 30.0–36.0)
MCV: 79.2 fL — ABNORMAL LOW (ref 80.0–100.0)
Monocytes Absolute: 0.9 K/uL (ref 0.1–1.0)
Monocytes Relative: 6 %
Neutro Abs: 13 K/uL — ABNORMAL HIGH (ref 1.7–7.7)
Neutrophils Relative %: 88 %
Platelets: 185 K/uL (ref 150–400)
RBC: 4.52 MIL/uL (ref 3.87–5.11)
RDW: 18 % — ABNORMAL HIGH (ref 11.5–15.5)
WBC: 14.9 K/uL — ABNORMAL HIGH (ref 4.0–10.5)
nRBC: 0 % (ref 0.0–0.2)

## 2024-07-25 LAB — RESP PANEL BY RT-PCR (RSV, FLU A&B, COVID)  RVPGX2
Influenza A by PCR: NEGATIVE
Influenza B by PCR: NEGATIVE
Resp Syncytial Virus by PCR: NEGATIVE
SARS Coronavirus 2 by RT PCR: NEGATIVE

## 2024-07-25 LAB — COMPREHENSIVE METABOLIC PANEL WITH GFR
ALT: 16 U/L (ref 0–44)
AST: 29 U/L (ref 15–41)
Albumin: 4 g/dL (ref 3.5–5.0)
Alkaline Phosphatase: 92 U/L (ref 38–126)
Anion gap: 21 — ABNORMAL HIGH (ref 5–15)
BUN: 17 mg/dL (ref 8–23)
CO2: 19 mmol/L — ABNORMAL LOW (ref 22–32)
Calcium: 8.8 mg/dL — ABNORMAL LOW (ref 8.9–10.3)
Chloride: 105 mmol/L (ref 98–111)
Creatinine, Ser: 1.07 mg/dL — ABNORMAL HIGH (ref 0.44–1.00)
GFR, Estimated: 58 mL/min — ABNORMAL LOW (ref >60)
Glucose, Bld: 49 mg/dL — ABNORMAL LOW (ref 70–99)
Potassium: 3.6 mmol/L (ref 3.5–5.1)
Sodium: 145 mmol/L (ref 135–145)
Total Bilirubin: 1.2 mg/dL (ref 0.0–1.2)
Total Protein: 7.5 g/dL (ref 6.5–8.1)

## 2024-07-25 LAB — I-STAT CG4 LACTIC ACID, ED: Lactic Acid, Venous: 2.8 mmol/L (ref 0.5–1.9)

## 2024-07-25 LAB — TROPONIN I (HIGH SENSITIVITY): Troponin I (High Sensitivity): 5 ng/L (ref <18)

## 2024-07-25 MED ORDER — LEVOFLOXACIN IN D5W 750 MG/150ML IV SOLN
750.0000 mg | Freq: Once | INTRAVENOUS | Status: DC
  Filled 2024-07-25: qty 150

## 2024-07-25 MED ORDER — LIDOCAINE 5 % EX PTCH
1.0000 | MEDICATED_PATCH | CUTANEOUS | 0 refills | Status: AC
  Filled 2024-07-26: qty 15, 15d supply, fill #0

## 2024-07-25 MED ORDER — DOXYCYCLINE HYCLATE 100 MG PO CAPS
100.0000 mg | ORAL_CAPSULE | Freq: Two times a day (BID) | ORAL | 0 refills | Status: AC
  Filled 2024-07-26: qty 10, 5d supply, fill #0

## 2024-07-25 MED ORDER — FENTANYL CITRATE (PF) 50 MCG/ML IJ SOSY
50.0000 ug | PREFILLED_SYRINGE | Freq: Once | INTRAMUSCULAR | Status: AC
  Administered 2024-07-25: 50 ug via INTRAVENOUS
  Filled 2024-07-25: qty 1

## 2024-07-25 MED ORDER — CYCLOBENZAPRINE HCL 10 MG PO TABS
10.0000 mg | ORAL_TABLET | Freq: Two times a day (BID) | ORAL | 0 refills | Status: AC | PRN
  Filled 2024-07-26: qty 20, 10d supply, fill #0
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
## 2024-07-25 NOTE — ED Notes (Signed)
 Pt ambulated with pulse ox, sated 97% and above
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
## 2024-07-26 ENCOUNTER — Other Ambulatory Visit: Payer: Self-pay

## 2024-07-26 ENCOUNTER — Encounter: Payer: Self-pay | Admitting: *Deleted

## 2024-07-26 NOTE — Congregational Nurse Program (Signed)
  Dept: [phone]   Congregational Nurse Program Note  Date of Encounter: 07/26/2024  Past Medical History: Past Medical History:  Diagnosis Date   Anxiety    Bipolar disorder (HCC)    Depression    ETOH abuse    Hypertension    Joint pain    hands    Encounter Details:  Community Questionnaire - 07/26/24 1526       Questionnaire   Ask client: Do you give verbal consent for me to treat you today? Yes    Student Assistance N/A    Location Patient Served  [facility]    Encounter Setting CN site    Population Status Unhoused    Insurance Medicaid    Insurance/Financial Assistance Referral N/A    Medication Have Medication Insecurities;Provided Medication Assistance    Medical Provider Yes    Screening Referrals Made N/A    Medical Referrals Made N/A    Medical Appointment Completed N/A    CNP Interventions Advocate/Support;Case Management    Screenings CN Performed N/A    ED Visit Averted N/A    Life-Saving Intervention Made N/A         Client was seen as Clinical research associate was leaving to go to pharmacy and main office. She said she had recently been to the hospital with pneumonia and asked if writer could assist with her medications. Writer took prescriptions to pharmacy and requested client see writer this afternoon to assess. Picked up medication except patches that were not covered by insurance and brought to [facility]. Client could not be located. Left medication at [facility] front desk with client's name and bed number. Akylah Hascall W RN CN

## 2024-07-27 ENCOUNTER — Ambulatory Visit (HOSPITAL_COMMUNITY): Payer: Self-pay | Admitting: Psychiatry

## 2024-07-27 NOTE — Congregational Nurse Program (Signed)
  Dept: [phone]   Congregational Nurse Program Note  Date of Encounter: 07/27/2024  Visit for follow-up from 07/25/2024 ER visit with pneumonia.  States she is still coughing up greenish mucous, had non productive cough during clinic visit.  BP 114/75, pulse 88 and regular, respirations 16, no wheezing or rales.  States she has taken medication twice daily as prescribed. Past Medical History: Past Medical History:  Diagnosis Date   Anxiety    Bipolar disorder (HCC)    Depression    ETOH abuse    Hypertension    Joint pain    hands    Encounter Details:  Community Questionnaire - 07/27/24 1015       Questionnaire   Ask client: Do you give verbal consent for me to treat you today? Yes    Student Assistance N/A    Location Patient Served  [facility]    Encounter Setting CN site    Population Status Unhoused    Insurance Medicaid    Insurance/Financial Assistance Referral N/A    Medication Have Medication Insecurities;Patient Medications Reviewed    Medical Provider Yes    Screening Referrals Made N/A    Medical Referrals Made Non-[facility] PCP/Clinic    Medical Appointment Completed N/A    CNP Interventions Advocate/Support;Case Management;Counsel;[facility]    Screenings CN Performed Blood Pressure;Weight    ED Visit Averted N/A    Life-Saving Intervention Made N/A

## 2024-07-28 ENCOUNTER — Encounter: Payer: Self-pay | Admitting: *Deleted

## 2024-07-28 ENCOUNTER — Encounter: Payer: Self-pay | Admitting: Physician Assistant

## 2024-07-28 NOTE — Congregational Nurse Program (Signed)
  Dept: [phone]   Congregational Nurse Program Note  Date of Encounter: 07/28/2024  Past Medical History: Past Medical History:  Diagnosis Date   Anxiety    Bipolar disorder (HCC)    Depression    ETOH abuse    Hypertension    Joint pain    hands    Encounter Details:  Community Questionnaire - 07/28/24 1224       Questionnaire   Ask client: Do you give verbal consent for me to treat you today? Yes    Student Assistance N/A    Location Patient Served  [facility]    Encounter Setting CN site    Population Status Unhoused    Insurance Medicaid    Insurance/Financial Assistance Referral N/A    Medication N/A    Medical Provider Yes    Screening Referrals Made N/A    Medical Referrals Made [facility] PCP/Clinic    Medical Appointment Completed N/A    CNP Interventions Advocate/Support    Screenings CN Performed Blood Pressure    ED Visit Averted N/A    Life-Saving Intervention Made N/A         Client signed sheet to see MD in [facility] clinic this afternoon. Completed triage form. Blood pressure (!) 150/88, pulse 81, last menstrual period 02/11/2011, SpO2 97%.  Baylyn Sickles W RN CN

## 2024-07-28 NOTE — Progress Notes (Signed)
 Pt recently hospitalized for PNA, d/c 10/12.  Has completed therapy, O2 sats 95% on room air.   Was prescribed 5% Lidocaine  patches but Medicaid would not fill them.  Would like the patches.   Wonders if she is taking her meds as prescribed. She says a med box would help, was given one.   She was given info on which meds can make her sleepy, this might help her not fall out again.   Was reminded that she has a PCP appt 11/24 w/ [name].  Needs dentist for her teeth, dentures.  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 She was asked to request this at PCP appt.   She missed an appt w/ Behavioral [facility], was requested to reschedule. Jan may help her with that.   She was given written info on her upcoming appts.   Wt Readings from Last 3 Encounters:  07/27/24 127 lb (57.6 kg)  07/06/24 127 lb (57.6 kg)  02/13/24 160 lb (72.6 kg)   Temp Readings from Last 3 Encounters:  07/27/24 97.8 F (36.6 C) (Oral)  07/25/24 97.8 F (36.6 C) (Oral)  02/17/24 98.1 F (36.7 C) (Oral)   BP Readings from Last 3 Encounters:  07/28/24 (!) 159/94  07/28/24 (!) 150/88  07/27/24 114/75   Pulse Readings from Last 3 Encounters:  07/28/24 88  07/28/24 81  07/27/24 88    Darrel Gloss, PA-C 07/28/2024 2:16 PM

## 2024-07-29 ENCOUNTER — Ambulatory Visit: Payer: MEDICAID

## 2024-07-29 DIAGNOSIS — Z23 Encounter for immunization: Secondary | ICD-10-CM

## 2024-07-29 NOTE — Congregational Nurse Program (Signed)
  Dept: [phone]   Congregational Nurse Program Note  Date of Encounter: 07/29/2024  Clinic visit to check blood pressure, BP 127/82, no complaints of back pain today.  Given flu vaccine by [employer]. States she has taken antibiotic as prescribed. Dept: [phone]   Congregational Nurse Program Note  Date of Encounter: 07/29/2024  Past Medical History: Past Medical History:  Diagnosis Date   Anxiety    Bipolar disorder (HCC)    Depression    ETOH abuse    Hypertension    Joint pain    hands    Encounter Details:  Community Questionnaire - 07/29/24 1035       Questionnaire   Ask client: Do you give verbal consent for me to treat you today? Yes    Student Assistance [employer] Nurse    Location Patient Served  [facility]    Encounter Setting CN site    Population Status Unhoused    Insurance Medicaid    Insurance/Financial Assistance Referral N/A    Medication Have Medication Insecurities    Medical Provider Yes    Screening Referrals Made N/A    Medical Referrals Made N/A    Medical Appointment Completed N/A    CNP Interventions Advocate/Support;Counsel    Screenings CN Performed Blood Pressure    ED Visit Averted N/A    Life-Saving Intervention Made N/A            Past Medical History: Past Medical History:  Diagnosis Date   Anxiety    Bipolar disorder (HCC)    Depression    ETOH abuse    Hypertension    Joint pain    hands    Encounter Details:  Community Questionnaire - 07/29/24 1035       Questionnaire   Ask client: Do you give verbal consent for me to treat you today? Yes    Student Assistance [employer] Nurse    Location Patient Served  [facility]    Encounter Setting CN site    Population Status Unhoused    Insurance Medicaid    Insurance/Financial Assistance Referral N/A    Medication Have Medication Insecurities    Medical Provider Yes    Screening Referrals Made N/A    Medical Referrals Made N/A    Medical Appointment Completed N/A    CNP  Interventions Advocate/Support;Counsel    Screenings CN Performed Blood Pressure    ED Visit Averted N/A    Life-Saving Intervention Made N/A

## 2024-07-30 LAB — CULTURE, BLOOD (ROUTINE X 2)
Culture: NO GROWTH
Culture: NO GROWTH
Special Requests: ADEQUATE

## 2024-08-02 ENCOUNTER — Encounter: Payer: Self-pay | Admitting: *Deleted

## 2024-08-02 NOTE — Congregational Nurse Program (Signed)
  Dept: [phone]   Congregational Nurse Program Note  Date of Encounter: 08/02/2024  Past Medical History: Past Medical History:  Diagnosis Date   Anxiety    Bipolar disorder (HCC)    Depression    ETOH abuse    Hypertension    Joint pain    hands    Encounter Details:  Community Questionnaire - 08/02/24 1152       Questionnaire   Ask client: Do you give verbal consent for me to treat you today? Yes    Student Assistance N/A    Location Patient Served  [facility]    Encounter Setting Phone/Text/Email;CN site    Population Status Unhoused    Insurance Medicaid    Insurance/Financial Assistance Referral N/A    Medication Have Medication Insecurities    Medical Provider Yes    Screening Referrals Made N/A    Medical Referrals Made N/A    Medical Appointment Completed N/A    CNP Interventions Case Management;Advocate/Support    Screenings CN Performed N/A    ED Visit Averted N/A    Life-Saving Intervention Made N/A         Client seen outside [facility] and requested writer get her another appt with Dr Laymon Bach at [facility] as she missed her last appt. Appointment made for Nov 4th 11a [address] [place]. Information left in envelope [facility] front desk with client's name and bed number per client request. Debbie Cox W RN CN

## 2024-08-05 NOTE — Progress Notes (Signed)
 Pt attended 07/07/2024 screening event with BP of 130/85 with a elevated blood sugar & A1C, pt is diabetic. At event pt did not indicate any SDOH needs.   Per initial f/u pt was reached out via phone and pt stated that she would not like any assistance form [facility] at this time. Pt was given [facility] contact info for future assistance. Pt has been frequently seen by Congregational Nurses and is currently getting medical assistance with her appts/medications.   Per chart review pt does have a PCP (being seen by Congregational Nurses despite not seeing PCP), insurance, and is a smoker. Pt has seen PCP 3 times since event, blood sugar and A1C addressed. Pt does indicate food, housing, & transportation SDOH needs at this time, but declined any [facility] assistance.  No additional pt f/u to be scheduled at this time per health equity protocol.

## 2024-08-11 ENCOUNTER — Other Ambulatory Visit: Payer: Self-pay

## 2024-08-11 ENCOUNTER — Encounter: Payer: Self-pay | Admitting: *Deleted

## 2024-08-11 NOTE — Congregational Nurse Program (Signed)
  Dept: [phone]   Congregational Nurse Program Note  Date of Encounter: 08/11/2024  Past Medical History: Past Medical History:  Diagnosis Date   Anxiety    Bipolar disorder (HCC)    Depression    ETOH abuse    Hypertension    Joint pain    hands    Encounter Details:  Community Questionnaire - 08/11/24 1212       Questionnaire   Ask client: Do you give verbal consent for me to treat you today? Yes    Student Assistance N/A    Location Patient Served  [facility]    Encounter Setting CN site;Phone/Text/Email    Population Status Unhoused    Insurance Medicaid    Insurance/Financial Assistance Referral N/A    Medication Have Medication Insecurities;Patient Medications Reviewed;Provided Medication Assistance    Medical Provider Yes    Screening Referrals Made N/A    Medical Referrals Made N/A    Medical Appointment Completed N/A    CNP Interventions Advocate/Support;Case Management    Screenings CN Performed Blood Pressure    ED Visit Averted N/A    Life-Saving Intervention Made N/A         Client came into nurse's office requesting assistance with medication and information about upcoming mental health appointment. Written information given to client about upcoming appointment. Contacted [facility] pharmacy and will pick up blood pressure medication tomorrow per client request. Checked vitals. Blood pressure 117/78, pulse 84, temperature 98.4 F (36.9 C), temperature source Oral, last menstrual period 02/11/2011, SpO2 94%. Client denies si and hi. Offered support and encouragement.  Chrishun Scheer W RN CN

## 2024-08-12 ENCOUNTER — Other Ambulatory Visit: Payer: Self-pay

## 2024-08-12 ENCOUNTER — Encounter: Payer: Self-pay | Admitting: *Deleted

## 2024-08-12 NOTE — Congregational Nurse Program (Signed)
  Dept: [phone]   Congregational Nurse Program Note  Date of Encounter: 08/12/2024  Past Medical History: Past Medical History:  Diagnosis Date   Anxiety    Bipolar disorder (HCC)    Depression    ETOH abuse    Hypertension    Joint pain    hands    Encounter Details:  Community Questionnaire - 08/12/24 1300       Questionnaire   Ask client: Do you give verbal consent for me to treat you today? Yes    Student Assistance N/A    Location Patient Served  [facility]    Encounter Setting Phone/Text/Email;CN site    Population Status Unhoused    Insurance Medicaid    Insurance/Financial Assistance Referral N/A    Medication Have Medication Insecurities;Provided Medication Assistance    Medical Provider Yes    Screening Referrals Made N/A    Medical Referrals Made N/A    Medical Appointment Completed N/A    CNP Interventions Advocate/Support;Case Management    Screenings CN Performed N/A    ED Visit Averted N/A    Life-Saving Intervention Made N/A        Contacted client by phone to remind her of appt this morning. Picked up amlodipine  and valsartan  from [facility] pharmacy per client request and brought to [facility]. Client can not be located at present time. Left medication along with ensure at [facility] front desk with client's name and bed number. Shontel Santee W RN CN

## 2024-08-17 ENCOUNTER — Encounter (HOSPITAL_COMMUNITY): Payer: Self-pay | Admitting: Psychiatry

## 2024-08-17 ENCOUNTER — Ambulatory Visit (INDEPENDENT_AMBULATORY_CARE_PROVIDER_SITE_OTHER): Payer: MEDICAID | Admitting: Psychiatry

## 2024-08-17 VITALS — BP 125/90 | HR 79 | Temp 98.0°F | Wt 126.2 lb

## 2024-08-17 DIAGNOSIS — F3162 Bipolar disorder, current episode mixed, moderate: Secondary | ICD-10-CM | POA: Diagnosis not present

## 2024-08-17 DIAGNOSIS — F411 Generalized anxiety disorder: Secondary | ICD-10-CM

## 2024-08-17 DIAGNOSIS — F1910 Other psychoactive substance abuse, uncomplicated: Secondary | ICD-10-CM

## 2024-08-17 MED ORDER — LURASIDONE HCL 40 MG PO TABS: 40.0000 mg | ORAL_TABLET | Freq: Every day | ORAL | 3 refills | Status: DC

## 2024-08-17 MED ORDER — GABAPENTIN 100 MG PO CAPS: 100.0000 mg | ORAL_CAPSULE | Freq: Three times a day (TID) | ORAL | 3 refills | Status: DC

## 2024-08-17 NOTE — Congregational Nurse Program (Signed)
  Dept: [phone]   Congregational Nurse Program Note  Date of Encounter: 08/17/2024   Patient needed appointment information for Behavioral Health appointment. Time and address given along with transportation voucher for appointment. Reviewed medications with patient and made sure she had adequate meds. Patient placed on list to see Dr. Perri tomorrow per her request.  Mitzie RAMAN RN, BSN congregational nurse  Past Medical History: Past Medical History:  Diagnosis Date   Anxiety    Bipolar disorder (HCC)    Depression    ETOH abuse    Hypertension    Joint pain    hands    Encounter Details:  Community Questionnaire - 08/17/24 0935       Questionnaire   Ask client: Do you give verbal consent for me to treat you today? Yes    Student Assistance N/A    Location Patient Served  [facility]    Encounter Setting CN site    Population Status Unhoused    Insurance Medicaid    Insurance/Financial Assistance Referral N/A    Medication Patient Medications Reviewed;Have Medication Insecurities    Medical Provider Yes    Screening Referrals Made N/A    Medical Referrals Made Non-[facility] PCP/Clinic    Medical Appointment Completed [facility]    CNP Interventions Advocate/Support;Navigate Healthcare System;Case Management;Spiritual Care;Counsel    Screenings CN Performed N/A    ED Visit Averted N/A    Life-Saving Intervention Made N/A
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
## 2024-08-18 ENCOUNTER — Other Ambulatory Visit (HOSPITAL_COMMUNITY): Payer: Self-pay | Admitting: Psychiatry

## 2024-08-18 ENCOUNTER — Encounter: Payer: Self-pay | Admitting: Physician Assistant

## 2024-08-18 ENCOUNTER — Other Ambulatory Visit: Payer: Self-pay

## 2024-08-18 DIAGNOSIS — F411 Generalized anxiety disorder: Secondary | ICD-10-CM

## 2024-08-18 DIAGNOSIS — F3162 Bipolar disorder, current episode mixed, moderate: Secondary | ICD-10-CM

## 2024-08-18 MED ORDER — GABAPENTIN 100 MG PO CAPS
100.0000 mg | ORAL_CAPSULE | Freq: Three times a day (TID) | ORAL | 3 refills | Status: DC
  Filled 2024-08-18 – 2024-08-19 (×3): qty 90, 30d supply, fill #0

## 2024-08-18 MED ORDER — LURASIDONE HCL 40 MG PO TABS
40.0000 mg | ORAL_TABLET | Freq: Every day | ORAL | 3 refills | Status: DC
  Filled 2024-08-18 – 2024-08-19 (×3): qty 30, 30d supply, fill #0
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
## 2024-08-19 ENCOUNTER — Encounter: Payer: Self-pay | Admitting: *Deleted

## 2024-08-19 ENCOUNTER — Other Ambulatory Visit: Payer: Self-pay

## 2024-08-19 NOTE — Congregational Nurse Program (Signed)
  Dept: [phone]   Congregational Nurse Program Note  Date of Encounter: 08/19/2024  Past Medical History: Past Medical History:  Diagnosis Date   Anxiety    Bipolar disorder (HCC)    Depression    ETOH abuse    Hypertension    Joint pain    hands    Encounter Details:  Community Questionnaire - 08/19/24 1506       Questionnaire   Ask client: Do you give verbal consent for me to treat you today? Yes    Student Assistance N/A    Location Patient Served  [facility]    Encounter Setting Phone/Text/Email    Population Status Unhoused    Insurance Medicaid    Insurance/Financial Assistance Referral N/A    Medication Have Medication Insecurities    Medical Provider Yes    Screening Referrals Made N/A    Medical Referrals Made N/A    Medical Appointment Completed N/A    CNP Interventions Advocate/Support;Case Management    Screenings CN Performed N/A    ED Visit Averted N/A    Life-Saving Intervention Made N/A        Writer went to [facility] pharmacy to p/u latuda  and gabapentin. Medications were being switched from [facility] and was told they would not be available until after 5:00. Contacted client by phone with this information. Writer left envelope at [facility] with bus tickets as requested for her to pick up medication. Debbie Cox W RN CN

## 2024-08-25 ENCOUNTER — Other Ambulatory Visit: Payer: Self-pay

## 2024-08-26 ENCOUNTER — Encounter: Payer: Self-pay | Admitting: *Deleted

## 2024-08-26 ENCOUNTER — Other Ambulatory Visit: Payer: Self-pay
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
## 2024-08-31 ENCOUNTER — Encounter: Payer: Self-pay | Admitting: Family Medicine

## 2024-08-31 ENCOUNTER — Ambulatory Visit: Payer: Self-pay | Admitting: Family Medicine

## 2024-08-31 NOTE — Congregational Nurse Program (Signed)
  Dept: [phone]   Congregational Nurse Program Note  Date of Encounter: 08/31/2024  Clinic visit to recheck blood pressure, BP 121/77, pulse 54 and regular.  Was very talkative and unable to sit still at today visit. Blood pressure lower today, states that she has taken medication as prescribed.  Complains of shoulder and neck pain primarily at night.     Past Medical History: Past Medical History:  Diagnosis Date   Anxiety    Bipolar disorder (HCC)    Depression    ETOH abuse    Hypertension    Joint pain    hands    Encounter Details:  Community Questionnaire - 08/31/24 1100       Questionnaire   Ask client: Do you give verbal consent for me to treat you today? N/A    Student Assistance N/A    Location Patient Served  [facility]    Encounter Setting CN site    Population Status Unhoused    Insurance Medicaid    Insurance/Financial Assistance Referral N/A    Medication Have Medication Insecurities;Patient Medications Reviewed    Medical Provider Yes    Screening Referrals Made N/A    Medical Referrals Made N/A    Medical Appointment Completed N/A    CNP Interventions Advocate/Support;Counsel;Spiritual Care    Screenings CN Performed Blood Pressure    ED Visit Averted N/A    Life-Saving Intervention Made N/A

## 2024-09-01 ENCOUNTER — Encounter: Payer: Self-pay | Admitting: Physician Assistant
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
## 2024-09-02 ENCOUNTER — Encounter: Payer: Self-pay | Admitting: *Deleted

## 2024-09-02 ENCOUNTER — Other Ambulatory Visit (HOSPITAL_COMMUNITY): Payer: Self-pay

## 2024-09-02 ENCOUNTER — Other Ambulatory Visit (HOSPITAL_COMMUNITY): Payer: Self-pay | Admitting: Psychiatry

## 2024-09-02 DIAGNOSIS — F3162 Bipolar disorder, current episode mixed, moderate: Secondary | ICD-10-CM

## 2024-09-02 MED ORDER — LURASIDONE HCL 40 MG PO TABS
40.0000 mg | ORAL_TABLET | Freq: Every day | ORAL | 3 refills | Status: DC
  Filled 2024-09-02 (×2): qty 30, 30d supply, fill #0

## 2024-09-02 NOTE — Congregational Nurse Program (Signed)
  Dept: [phone]   Congregational Nurse Program Note  Date of Encounter: 09/02/2024  Past Medical History: Past Medical History:  Diagnosis Date   Anxiety    Bipolar disorder (HCC)    Depression    ETOH abuse    Hypertension    Joint pain    hands    Encounter Details:  Community Questionnaire - 09/02/24 1444       Questionnaire   Ask client: Do you give verbal consent for me to treat you today? Yes    Student Assistance N/A    Location Patient Served  [facility]    Encounter Setting CN site;Phone/Text/Email    Population Status Unhoused    Insurance Medicaid    Insurance/Financial Assistance Referral N/A    Medication Have Medication Insecurities;Provided Medication Assistance;Patient Medications Reviewed    Medical Provider Yes    Screening Referrals Made N/A    Medical Referrals Made N/A    Medical Appointment Completed N/A    CNP Interventions Advocate/Support;Navigate Healthcare System;Case Management    Screenings CN Performed N/A    ED Visit Averted N/A    Life-Saving Intervention Made N/A         Contacted Laymon Bach for refill of Latuda  as client reports medications are missing. Picked up medication from [facility] pharmacy and brought to [facility]. Filled pill box. Explained to client medications needed to stay secured with [facility] staff. Reminded client of upcoming appt Nov 24th and alerted SW. Amel Kitch W RN CN

## 2024-09-02 NOTE — Progress Notes (Signed)
 Nursing Intake Note Paediatric Nurse Health  Chief Complaint:  BP Check Living Situation:  Insurance Status:     New Patient Status:  [x]  New to [employer] practices reviewed  HIPAA form signed and documented  Consent for digital charting: [x]  Signed []  Not Signed  Additional Notes:  Vital signs taken and entered in flowsheet  Interpreter services: []  Needed [x]  Not Needed  Patient oriented to mobile clinic services and process  SDOH screening completed  Referral to provider: []  Needed [x]  Not Needed  RN Interventions Provided:  [x]  Health education (e.g., chronic disease, hygiene, nutrition)

## 2024-09-06 ENCOUNTER — Ambulatory Visit: Payer: MEDICAID | Admitting: Family Medicine

## 2024-09-06 ENCOUNTER — Telehealth: Payer: Self-pay | Admitting: Family Medicine

## 2024-09-06 NOTE — Telephone Encounter (Signed)
 Called patient about missed appt on 11/24. Left message

## 2024-09-07 ENCOUNTER — Encounter: Payer: Self-pay | Admitting: Physician Assistant

## 2024-09-07 ENCOUNTER — Other Ambulatory Visit: Payer: Self-pay | Admitting: Physician Assistant

## 2024-09-07 ENCOUNTER — Other Ambulatory Visit: Payer: Self-pay

## 2024-09-07 DIAGNOSIS — I10 Essential (primary) hypertension: Secondary | ICD-10-CM

## 2024-09-07 MED ORDER — AMLODIPINE BESYLATE 10 MG PO TABS
10.0000 mg | ORAL_TABLET | Freq: Every day | ORAL | 1 refills | Status: AC
  Filled 2024-09-07: qty 30, 30d supply, fill #0
  Filled 2024-10-11: qty 30, 30d supply, fill #1

## 2024-09-07 MED ORDER — VALSARTAN 40 MG PO TABS
40.0000 mg | ORAL_TABLET | Freq: Every day | ORAL | 3 refills | Status: AC
  Filled 2024-09-07: qty 30, 30d supply, fill #0
  Filled 2024-10-18: qty 30, 30d supply, fill #1

## 2024-09-07 NOTE — Progress Notes (Cosign Needed Addendum)
 Pt did get her Latuda  sent in on 11/20.  Med box filled w/ available meds. Needs to get refills on amlodipine  and valsartan  >> sent in. Cetirizine  not filled since 09/23 and pt w/ allergy complaints so put in loratadine .   She found the bottles of Latuda  and gapabentin prescribed on 11/06 still unopened.   Pt concerned about Medicare part B.  She is concerned about having no teeth.  She is concerned about her appointments. She missed 11/24 new PCP appt.   She was advised that she needs to keep PCP appts in order to get a referral to Ortho for shoulder pain and neck pain. They can help w/ Dental as well.   She went to [employer] but was told she needs to appeal. However, other Dentists may take her insurance.   She asks to take a gabapentin  and was given an allergy pill as well.   Her Ensure's were taken, we will give her more but will not put them in the refrigerator.   Today's Vitals   09/07/24 1046  BP: 116/83  Pulse: 78  SpO2: 97%   There is no height or weight on file to calculate BMI.    Shona Shad, PA-C 09/07/2024 10:59 AM

## 2024-09-07 NOTE — Progress Notes (Signed)
 See note

## 2024-09-13 ENCOUNTER — Encounter: Payer: Self-pay | Admitting: *Deleted

## 2024-09-13 NOTE — Congregational Nurse Program (Signed)
  Dept: [phone]   Congregational Nurse Program Note  Date of Encounter: 09/13/2024  Past Medical History: Past Medical History:  Diagnosis Date   Anxiety    Bipolar disorder (HCC)    Depression    ETOH abuse    Hypertension    Joint pain    hands    Encounter Details:  Community Questionnaire - 09/13/24 1035       Questionnaire   Ask client: Do you give verbal consent for me to treat you today? Yes    Student Assistance N/A    Location Patient Served  [facility]    Encounter Setting CN site    Population Status Unhoused    Insurance Medicaid    Insurance/Financial Assistance Referral N/A    Medication Have Medication Insecurities;Provided Medication Assistance;Patient Medications Reviewed    Medical Provider Yes    Screening Referrals Made N/A    Medical Referrals Made N/A    Medical Appointment Completed N/A    CNP Interventions Advocate/Support;Case Management;Educate    Screenings CN Performed N/A    ED Visit Averted N/A    Life-Saving Intervention Made N/A         Client came to nurse's office in a hurry on her way to job interview with her bag of medication requesting assistance with pill container. Client reports she will only take her medication as HS. Placed amlodipine , latuda , valsartan  and one gabapentin  in daily container. Explained gabapentin  is taken three times a day and is to be spread out.

## 2024-09-14 ENCOUNTER — Ambulatory Visit (INDEPENDENT_AMBULATORY_CARE_PROVIDER_SITE_OTHER): Payer: MEDICAID | Admitting: Psychiatry

## 2024-09-14 ENCOUNTER — Other Ambulatory Visit: Payer: Self-pay

## 2024-09-14 ENCOUNTER — Encounter (HOSPITAL_COMMUNITY): Payer: Self-pay | Admitting: Psychiatry

## 2024-09-14 DIAGNOSIS — F411 Generalized anxiety disorder: Secondary | ICD-10-CM | POA: Diagnosis not present

## 2024-09-14 DIAGNOSIS — F3162 Bipolar disorder, current episode mixed, moderate: Secondary | ICD-10-CM | POA: Diagnosis not present

## 2024-09-14 MED ORDER — GABAPENTIN 300 MG PO CAPS
300.0000 mg | ORAL_CAPSULE | Freq: Three times a day (TID) | ORAL | 3 refills | Status: DC
  Filled 2024-09-14: qty 90, 30d supply, fill #0

## 2024-09-14 NOTE — Progress Notes (Signed)
 [facility] MD/PA/NP OP Progress Note  09/14/2024 1:06 PM Debbie Cox  MRN:  [id number]  Chief Complaint: I have been agitated and having panic attacks  HPI:  63 year old female seen today for initial psychiatric evaluation.   Psychiatric history of polysubstance use (cocaine, marijuana, tobacco use, and alcohol use), bipolar disorder, depression, and PTSD.  She is prescribed Latuda  40 mg nightly and gabapentin  100 mg three times daily . Today she reports that her medications are not effective.  Today she is well-groomed, pleasant, and cooperative.  Patient hyperverbal and distractible today. She describes herself as being agitated.  She also notes that she has been having frequent panic attacks.  Throughout the exam patient cursed, raised her voices, and was fidgety.  She informed clinical research associate that recently she had a panic attack at the homeless shelter and notes that she was given a paper bag to help control her breathing.  At times she notes that she is distracted and forgetful.  She also notes that she has been having racing thoughts.  She informed clinical research associate that Latuda  is not as effective as it once was.  Provider recommended increasing Latuda  however patient was not agreeable.  Patient unable to do a GAD-7 or PHQ-9 today as she was agitated.  She does note that she is sleeping well.  Patient informed clinical research associate that recently she had a hallucination.  She notes that she saw her deceased mother and notes that her mother told her that it would be okay.  Today she denies SI/HI or paranoia.    Provider asked patient if she was taking illegal substances.  She notes that she did smoke marijuana twice to help her calm down.  She also informed writer that she drank  two 16 ounces of beer twice.  She denies frequent marijuana or alcohol use.  Patient reports that she continues to be in pain.  She notes that she sees her PCP Dr. Brien tomorrow to address her pain.  She notes the gabapentin  has been somewhat  effective.  Today gabapentin  100 mg 3 times daily increased to 300 mg 3 times daily to help address pain and mood.  At this time patient request that Latuda  not be increased.  Provider recommended doing labs (urinalysis, CBC, CMP, LFT, thyroid  panel, lipid panel, and HgbA1c) however patient was not agreeable.  Provider informed patient that if she has a UTI it could explain some of her agitation and forgetfulness.  Patient notes that she will consider doing these labs in the future.  Provider informed patient that Latuda  can also be increased at her next visit.  Provider informed patient that other antipsychotics could be tried as well such as Seroquel  (reports was effective), Haldol, Geodon, Invega.  She endorsed understanding and agreed. No other concerns noted at this time.    Visit Diagnosis:    ICD-10-CM   1. Bipolar disorder, current episode mixed, moderate (HCC)  F31.62 gabapentin  (NEURONTIN ) 300 MG capsule    2. Generalized anxiety disorder  F41.1 gabapentin  (NEURONTIN ) 300 MG capsule      Past Psychiatric History:  polysubstance use (cocaine, marijuana, tobacco use, and alcohol use), bipolar disorder, depression, and PTSD  Past Medical History:  Past Medical History:  Diagnosis Date   Anxiety    Bipolar disorder (HCC)    Depression    ETOH abuse    Hypertension    Joint pain    hands    Past Surgical History:  Procedure Laterality Date   CESAREAN SECTION  MULTIPLE EXTRACTIONS WITH ALVEOLOPLASTY Bilateral 12/20/2016   Procedure: BILATERAL TORI;  Surgeon: Glendia Primrose, DDS;  Location: [facility] OR;  Service: Oral Surgery;  Laterality: Bilateral;    Family Psychiatric History:  Mother bipolar disorder, daughter ADHD, son unknown mental health issues    Family History:  Family History  Adopted: Yes    Social History:  Social History   Socioeconomic History   Marital status: Single    Spouse name: Not on file   Number of children: Not on file   Years of education: Not on  file   Highest education level: Not on file  Occupational History   Not on file  Tobacco Use   Smoking status: Every Day    Current packs/day: 0.50    Average packs/day: 0.5 packs/day for 35.0 years (17.5 ttl pk-yrs)    Types: Cigarettes   Smokeless tobacco: Never  Substance and Sexual Activity   Alcohol use: Yes    Alcohol/week: 12.0 standard drinks of alcohol    Types: 12 Glasses of wine per week   Drug use: No   Sexual activity: Not on file  Other Topics Concern   Not on file  Social History Narrative   Not on file   Social Drivers of Health   Financial Resource Strain: Low Risk  (07/15/2024)   Overall Financial Resource Strain (CARDIA)    Difficulty of Paying Living Expenses: Not hard at all  Food Insecurity: Food Insecurity Present (07/15/2024)   Hunger Vital Sign    Worried About Running Out of Food in the Last Year: Sometimes true    Ran Out of Food in the Last Year: Sometimes true  Transportation Needs: Unmet Transportation Needs (07/15/2024)   PRAPARE - Administrator, Civil Service (Medical): Yes    Lack of Transportation (Non-Medical): Yes  Physical Activity: Inactive (07/15/2024)   Exercise Vital Sign    Days of Exercise per Week: 0 days    Minutes of Exercise per Session: Not on file  Stress: Stress Concern Present (07/15/2024)   [employer] of Occupational Health - Occupational Stress Questionnaire    Feeling of Stress: Very much  Social Connections: Moderately Isolated (07/15/2024)   Social Connection and Isolation Panel    Frequency of Communication with Friends and Family: More than three times a week    Frequency of Social Gatherings with Friends and Family: Never    Attends Religious Services: 1 to 4 times per year    Active Member of [employer] or Organizations: No    Attends Banker Meetings: Never    Marital Status: Widowed    Allergies:  Allergies  Allergen Reactions   Penicillins Anaphylaxis, Swelling and Other (See  Comments)    Tongue swelled and couldn't breathe Has patient had a PCN reaction causing immediate rash, facial/tongue/throat swelling, SOB or lightheadedness with hypotension: Yes Has patient had a PCN reaction causing severe rash involving mucus membranes or skin necrosis: No Has patient had a PCN reaction that required hospitalization No Has patient had a PCN reaction occurring within the last 10 years: Yes If all of the above answers are NO, then may proceed with Cephalosporin use.    Niacin And Related Hives and Other (See Comments)    REACTION NOT AN ALLERGY > FLUSHING EXPECTED WITH NIACIN     Metabolic Disorder Labs: Lab Results  Component Value Date   HGBA1C 4.8 06/25/2021   MPG 91.06 06/25/2021   No results found for: PROLACTIN Lab Results  Component  Value Date   CHOL 181 06/25/2021   TRIG 135 06/25/2021   HDL 80 06/25/2021   CHOLHDL 2.3 06/25/2021   VLDL 27 06/25/2021   LDLCALC 74 06/25/2021   Lab Results  Component Value Date   TSH 1.005 06/25/2021    Therapeutic Level Labs: No results found for: LITHIUM No results found for: VALPROATE No results found for: CBMZ  Current Medications: Current Outpatient Medications  Medication Sig Dispense Refill   amLODipine  (NORVASC ) 10 MG tablet Take 1 tablet (10 mg total) by mouth daily. 30 tablet 1   aspirin EC 81 MG tablet Take 1 tablet by mouth daily.     benzonatate  (TESSALON ) 100 MG capsule Take 1 to 2 capsules (200 mg total) by mouth 3 (three) times daily as needed 20 capsule 0   cetirizine  (ZYRTEC ) 10 MG tablet Take 1 tablet (10 mg total) by mouth daily as needed for allergies. 30 tablet 1   cyclobenzaprine  (FLEXERIL ) 10 MG tablet Take 1 tablet (10 mg total) by mouth 2 (two) times daily as needed for muscle spasms. 20 tablet 0   gabapentin  (NEURONTIN ) 300 MG capsule Take 1 capsule (300 mg total) by mouth 3 (three) times daily. 90 capsule 3   ibuprofen  (ADVIL ) 600 MG tablet Take 1 tablet (600 mg total) by  mouth every 6 (six) hours as needed. 30 tablet 0   lidocaine  (LIDODERM ) 5 % Place 1 patch onto the skin daily. Remove & Discard patch within 12 hours or as directed by MD 30 patch 0   lidocaine  (LIDODERM ) 5 % Place 1 patch onto the skin daily. Remove & Discard patch within 12 hours or as directed by MD 15 patch 0   lurasidone  (LATUDA ) 40 MG TABS tablet Take 1 tablet (40 mg total) by mouth daily with breakfast. 30 tablet 3   meloxicam (MOBIC) 7.5 MG tablet Take 1 tablet by mouth daily.     methocarbamol  (ROBAXIN ) 750 MG tablet Take 1 tablet (750 mg total) by mouth 3 (three) times daily. 15 tablet 0   valsartan  (DIOVAN ) 40 MG tablet Take 1 tablet (40 mg total) by mouth daily. 30 tablet 3   No current facility-administered medications for this visit.     Musculoskeletal: Strength & Muscle Tone: within normal limits Gait & Station: normal Patient leans: N/A  Psychiatric Specialty Exam: Review of Systems  Last menstrual period 02/11/2011.There is no height or weight on file to calculate BMI.  General Appearance: Well Groomed  Eye Contact:  Good  Speech:  Pressured  Volume:  Normal  Mood:  Anxious and Irritable  Affect:  Appropriate and Congruent  Thought Process:  Coherent, Goal Directed, and Linear  Orientation:  Full (Time, Place, and Person)  Thought Content: Logical and Hallucinations: Visual   Suicidal Thoughts:  No  Homicidal Thoughts:  No  Memory:  Immediate;   Good Recent;   Good Remote;   Good  Judgement:  Good  Insight:  Good  Psychomotor Activity:  Restlessness  Concentration:  Concentration: Good and Attention Span: Good  Recall:  Good  Fund of Knowledge: Good  Language: Good  Akathisia:  No  Handed:  Right  AIMS (if indicated): not done  Assets:  Communication Skills Desire for Improvement Financial Resources/Insurance Housing Leisure Time Social Support Vocational/Educational  ADL's:  Intact  Cognition: WNL  Sleep:  Good   Screenings: AUDIT     Advertising Copywriter from 07/15/2024 in [facility]  Alcohol Use Disorder Identification Test Final Score (  AUDIT) 13   GAD-7    Flowsheet Row Office Visit from 08/17/2024 in [facility] Counselor from 07/15/2024 in [facility] Office Visit from 07/06/2024 in [facility]  Total GAD-7 Score 19 18 15    PHQ2-9    Flowsheet Row Office Visit from 08/17/2024 in [facility] Counselor from 07/15/2024 in [facility] Office Visit from 07/06/2024 in [facility] [facility] ED from 07/18/2021 in [facility] ED from 06/25/2021 in [facility]  PHQ-2 Total Score 5 4 6 3 2   PHQ-9 Total Score 18 17 19 12 12    Flowsheet Row ED from 07/25/2024 in [facility] Emergency Department at [facility] Counselor from 07/15/2024 in [facility] ED from 02/17/2024 in [facility] Emergency Department at [facility]  C-SSRS RISK CATEGORY No Risk Moderate Risk No Risk     Assessment and Plan: Patient endorses increased agitation and frequent panic attacks.  At this time she does not wish to change her antipsychotic.  She is agreeable to increasing gabapentin  100 mg 3 times daily up to 300 mg 3 times daily.  Provider discussed adjusting management of arthritis suggested (Seroquel , or Invega, Geodon).   Provider recommended doing labs (urinalysis, CBC, CMP, LFT, thyroid  panel, lipid panel, and HgbA1c) however patient was not agreeable.  Provider informed patient that if she has a UTI it could explain some of her agitation and forgetfulness.  Patient notes that she will consider doing these labs in the future.    1. Bipolar disorder, current episode mixed, moderate (HCC)  Increased- gabapentin  (NEURONTIN ) 300 MG capsule; Take 1 capsule (300 mg total) by mouth 3 (three) times daily.   Dispense: 90 capsule; Refill: 3  2. Generalized anxiety disorder  Increased- gabapentin  (NEURONTIN ) 300 MG capsule; Take 1 capsule (300 mg total) by mouth 3 (three) times daily.  Dispense: 90 capsule; Refill: 3  Collaboration of Care: Collaboration of Care: Other provider involved in patient's care AEB PCP  Patient/Guardian was advised Release of Information must be obtained prior to any record release in order to collaborate their care with an outside provider. Patient/Guardian was advised if they have not already done so to contact the registration department to sign all necessary forms in order for us  to release information regarding their care.   Consent: Patient/Guardian gives verbal consent for treatment and assignment of benefits for services provided during this visit. Patient/Guardian expressed understanding and agreed to proceed.   Follow-up in 1.5 months Zane FORBES Bach, NP 09/14/2024, 1:06 PM

## 2024-09-15 ENCOUNTER — Encounter: Payer: Self-pay | Admitting: Physician Assistant

## 2024-09-15 ENCOUNTER — Encounter: Payer: Self-pay | Admitting: *Deleted

## 2024-09-15 NOTE — Congregational Nurse Program (Signed)
  Dept: [phone]   Congregational Nurse Program Note  Date of Encounter: 09/15/2024  Past Medical History: Past Medical History:  Diagnosis Date   Anxiety    Bipolar disorder (HCC)    Depression    ETOH abuse    Hypertension    Joint pain    hands    Encounter Details:  Community Questionnaire - 09/15/24 1355       Questionnaire   Ask client: Do you give verbal consent for me to treat you today? Yes    Student Assistance N/A    Location Patient Served  [facility]    Encounter Setting CN site    Population Status Unhoused    Insurance Medicaid    Insurance/Financial Assistance Referral N/A    Medication N/A    Medical Provider Yes    Screening Referrals Made N/A    Medical Referrals Made [facility] PCP/Clinic    Medical Appointment Completed N/A    CNP Interventions Advocate/Support    Screenings CN Performed Blood Pressure    ED Visit Averted N/A    Life-Saving Intervention Made N/A        Client came to nurse's office requesting to see MD. Completed triage form to see MD in [facility] clinic this afternoon. Blood pressure 109/74, pulse 72, last menstrual period 02/11/2011.  Stpehanie Montroy W RN CN

## 2024-09-15 NOTE — Progress Notes (Cosign Needed Addendum)
 Mgm Mirage  S: 63 yo with bipolar, hx substance abuse, HTN  She's upset today, almost inconsolable - difficult to keep on track.   Mostly seems worried about a potential loss of housing, losing her place at the shelter.    She doesn't feel herself.  Is having panic attacks.  Can't remember things.  Notes suicidal ideations.  Denies a plan.  Notes she wouldn't do this bc then wouldn't go to heaven.    Was prescribed gabapentin .  Mentions she was offered seroquel ?  O: There were no vitals filed for this visit. General:upset, distressed, crying Lungs: unlabored Neurological: Alert and oriented 3. Moves all extremities 4 with equal strength. Cranial nerves II through XII grossly intact. Skin: Warm and dry.  Psychiatric: tangential speech, pressured speech, crying at times   A/P:  Mood Disorder Saw her NP yesterday who prescribed 300 mg gabapentin  TID in addition to the latuda .  [name] notes that she offered seroquel  as well, I will reach out to her NP to clarify this.  She's concerned about her medications and medication effects.  I think she'd benefit from close follow up with providers.   Debbie Monte, MD  ----------------------------  Pt is manic today.  Med box filled 12/01, Monday pills present, Tuesday pills taken. She says she has been taking them.   She saw [name] 12/02, she increased the Gabapentin  100 >> 300 mg tid.  [name] recommended she increase the Latuda , but [name] refused. [name] also recommended lab work, which [name] refused.  She has appt w/ [name] on 10/28/2024 at 10:30 am.   She lists all the medicines and diagnoses she was told when being treated at the [facility], but admits she has no records to back up her statements.   She has appt w/ [name] on Nov 08, 2024 at 1 pm. She says that will be too late. She says she will go to the office and wait there to be seen.   She is  upset that she feels so crazy. She can't remember appts.   She is agitated and angry, doesn't know why.   She says that the Shelter told her that if she does not get on her meds, they will exit her on 12/10. She is not sure about this.   She says the MS pain is so bad and she is so upset, that she has thought about killing herself. She does not have a plan and wants to go to heaven so does not want to commit suicide.   She says she thought about things and feels she would do better on Seroquel . [name] contacted [name] about changing to Seroquel .   She will talk to her PCP about back and shoulder pain.   She wants us  to talk to her case manager and Debbie Cox to keep her from getting put out.  She is extremely upset, Debbie Cox came and took her to another office.     Debbie Shad, PA-C 09/15/2024 4:11 PM

## 2024-09-16 ENCOUNTER — Encounter: Payer: Self-pay | Admitting: *Deleted

## 2024-09-16 NOTE — Congregational Nurse Program (Signed)
  Dept: [phone]   Congregational Nurse Program Note  Date of Encounter: 09/16/2024  Past Medical History: Past Medical History:  Diagnosis Date   Anxiety    Bipolar disorder (HCC)    Depression    ETOH abuse    Hypertension    Joint pain    hands    Encounter Details:  Community Questionnaire - 09/16/24 1442       Questionnaire   Ask client: Do you give verbal consent for me to treat you today? N/A    Student Assistance N/A    Location Patient Served  [facility]    Encounter Setting CN site    Population Status Unhoused    Insurance Medicaid    Insurance/Financial Assistance Referral N/A    Medication N/A    Medical Provider Yes    Screening Referrals Made N/A    Medical Referrals Made N/A    Medical Appointment Completed N/A    CNP Interventions Advocate/Support;Case Management    Screenings CN Performed N/A    ED Visit Averted N/A    Life-Saving Intervention Made N/A         Received message from MD to p/u Gabapentin  from [facility] pharmacy. Picked up medication and brought to [facility]. Writer could not locate client. Pill container has several days missed this week. She has almost a full bottle of Gabapentin  in her medication bag. Left new bottle in pharmacy bag and placed in her medication bag. Will try to locate client again on writer's next workday Monday.  Leisl Spurrier W RN CN

## 2024-09-20 ENCOUNTER — Ambulatory Visit: Payer: Self-pay

## 2024-09-20 ENCOUNTER — Encounter: Payer: Self-pay | Admitting: *Deleted

## 2024-09-20 NOTE — Congregational Nurse Program (Signed)
  Dept: [phone]   Congregational Nurse Program Note  Date of Encounter: 09/20/2024  Past Medical History: Past Medical History:  Diagnosis Date   Anxiety    Bipolar disorder (HCC)    Depression    ETOH abuse    Hypertension    Joint pain    hands    Encounter Details:  Community Questionnaire - 09/20/24 1325       Questionnaire   Ask client: Do you give verbal consent for me to treat you today? Yes    Student Assistance N/A    Location Patient Served  [facility]    Encounter Setting CN site    Population Status Unhoused    Insurance Medicaid    Insurance/Financial Assistance Referral N/A    Medication N/A    Medical Provider Yes    Screening Referrals Made N/A    Medical Referrals Made N/A    Medical Appointment Completed N/A    CNP Interventions Advocate/Support;Case Management    Screenings CN Performed N/A    ED Visit Averted N/A    Life-Saving Intervention Made N/A        Client seen at [facility]. Client is tangential in speech and has difficulty sitting still. Explained verbally and written that client needs to go to [facility] [address] and ask for labs per MD. Order has already been placed. Client asked for concrete time. Wrote Tuesday Dec 9th 9:00. Offered to fill pill box and client refused saying she was taking it to her doctor's appt tomorrow. Explained again that the appt was for labs. She declined  to allow writer assist her with medication pill box and declined blood pressure check. Sulayman Manning W RN CN

## 2024-09-20 NOTE — Telephone Encounter (Signed)
 Current phone number [phone]  Copied from CRM [id number]. Topic: Clinical - Red Word Triage >> Sep 20, 2024  2:03 PM Debbie Cox wrote:  Pt is on the line crying for help she is scheduled for a new pt appointment on Jan 21st and states she can't wait, she lives in a shelter and has been referred to Dr tanda by Dr. Brien who comes to serve the shelter she stays in. Pt is very emotional and states she wants to go sit in the lobby of the offic until someone cancels so she can be seen sooner. Pt is battling some mental, physical and lifestyle struggles. Reason for Disposition  Requesting regular office appointment  Answer Assessment - Initial Assessment Questions 1. REASON FOR CALL: What is the main reason for your call? or How can I best help you?     Patient called to report concerns over timing of scheduled appointment and would like to be seen before the beginning of the year.   2. SYMPTOMS : Do you have any symptoms?      Spent 23 minutes on phone with patient.  During this time, she spoke to myself and other people who passed by.  She expressed seeming racing thoughts with little break. She plans to go up the the [facility] office and wait for an appointment. She lives in a homeless shelter and is unable to be inside during the day  Protocols used: Information Only Call - No Triage-A-AH

## 2024-09-21 ENCOUNTER — Telehealth: Payer: Self-pay | Admitting: Critical Care Medicine

## 2024-09-21 NOTE — Telephone Encounter (Signed)
 Patient came in requesting to speak with Dr.Wright and was inquiring where to get labs, I informed patient that he was here but has left for the day and I advised her that I hadn't seen any recent lab orders. Patient verbally understood and stated she would she him tomorrow at the shelter.

## 2024-09-22 ENCOUNTER — Encounter: Payer: Self-pay | Admitting: Physician Assistant

## 2024-09-22 ENCOUNTER — Ambulatory Visit: Payer: MEDICAID | Admitting: Physician Assistant

## 2024-09-22 VITALS — BP 115/67 | HR 64

## 2024-09-22 DIAGNOSIS — Z79899 Other long term (current) drug therapy: Secondary | ICD-10-CM

## 2024-09-22 DIAGNOSIS — Z5901 Sheltered homelessness: Secondary | ICD-10-CM

## 2024-09-22 DIAGNOSIS — F333 Major depressive disorder, recurrent, severe with psychotic symptoms: Secondary | ICD-10-CM

## 2024-09-22 DIAGNOSIS — Z608 Other problems related to social environment: Secondary | ICD-10-CM

## 2024-09-22 NOTE — Patient Instructions (Signed)
 We will call you with todays lab results  Kirk CANDIE Sage, PA-C Physician Assistant [facility] [email or web address]  Health Maintenance, Female Adopting a healthy lifestyle and getting preventive care are important in promoting health and wellness. Ask your health care provider about: The right schedule for you to have regular tests and exams. Things you can do on your own to prevent diseases and keep yourself healthy. What should I know about diet, weight, and exercise? Eat a healthy diet  Eat a diet that includes plenty of vegetables, fruits, low-fat dairy products, and lean protein. Do not eat a lot of foods that are high in solid fats, added sugars, or sodium. Maintain a healthy weight Body mass index (BMI) is used to identify weight problems. It estimates body fat based on height and weight. Your health care provider can help determine your BMI and help you achieve or maintain a healthy weight. Get regular exercise Get regular exercise. This is one of the most important things you can do for your health. Most adults should: Exercise for at least 150 minutes each week. The exercise should increase your heart rate and make you sweat (moderate-intensity exercise). Do strengthening exercises at least twice a week. This is in addition to the moderate-intensity exercise. Spend less time sitting. Even light physical activity can be beneficial. Watch cholesterol and blood lipids Have your blood tested for lipids and cholesterol at 64 years of age, then have this test every 5 years. Have your cholesterol levels checked more often if: Your lipid or cholesterol levels are high. You are older than 64 years of age. You are at high risk for heart disease. What should I know about cancer screening? Depending on your health history and family history, you may need to have cancer screening at various ages. This may include screening for: Breast  cancer. Cervical cancer. Colorectal cancer. Skin cancer. Lung cancer. What should I know about heart disease, diabetes, and high blood pressure? Blood pressure and heart disease High blood pressure causes heart disease and increases the risk of stroke. This is more likely to develop in people who have high blood pressure readings or are overweight. Have your blood pressure checked: Every 3-5 years if you are 78-78 years of age. Every year if you are 78 years old or older. Diabetes Have regular diabetes screenings. This checks your fasting blood sugar level. Have the screening done: Once every three years after age 21 if you are at a normal weight and have a low risk for diabetes. More often and at a younger age if you are overweight or have a high risk for diabetes. What should I know about preventing infection? Hepatitis B If you have a higher risk for hepatitis B, you should be screened for this virus. Talk with your health care provider to find out if you are at risk for hepatitis B infection. Hepatitis C Testing is recommended for: Everyone born from [birth year] through 1966. Anyone with known risk factors for hepatitis C. Sexually transmitted infections (STIs) Get screened for STIs, including gonorrhea and chlamydia, if: You are sexually active and are younger than 64 years of age. You are older than 64 years of age and your health care provider tells you that you are at risk for this type of infection. Your sexual activity has changed since you were last screened, and you are at increased risk for chlamydia or gonorrhea. Ask your health care provider if you are at risk. Ask your health care provider about  whether you are at high risk for HIV. Your health care provider may recommend a prescription medicine to help prevent HIV infection. If you choose to take medicine to prevent HIV, you should first get tested for HIV. You should then be tested every 3 months for as long as you are taking  the medicine. Pregnancy If you are about to stop having your period (premenopausal) and you may become pregnant, seek counseling before you get pregnant. Take 400 to 800 micrograms (mcg) of folic acid every day if you become pregnant. Ask for birth control (contraception) if you want to prevent pregnancy. Osteoporosis and menopause Osteoporosis is a disease in which the bones lose minerals and strength with aging. This can result in bone fractures. If you are 7 years old or older, or if you are at risk for osteoporosis and fractures, ask your health care provider if you should: Be screened for bone loss. Take a calcium or vitamin D supplement to lower your risk of fractures. Be given hormone replacement therapy (HRT) to treat symptoms of menopause. Follow these instructions at home: Alcohol use Do not drink alcohol if: Your health care provider tells you not to drink. You are pregnant, may be pregnant, or are planning to become pregnant. If you drink alcohol: Limit how much you have to: 0-1 drink a day. Know how much alcohol is in your drink. In the [place] one drink equals one 12 oz bottle of beer (355 mL), one 5 oz glass of wine (148 mL), or one 1 oz glass of hard liquor (44 mL). Lifestyle Do not use any products that contain nicotine  or tobacco. These products include cigarettes, chewing tobacco, and vaping devices, such as e-cigarettes. If you need help quitting, ask your health care provider. Do not use street drugs. Do not share needles. Ask your health care provider for help if you need support or information about quitting drugs. General instructions Schedule regular health, dental, and eye exams. Stay current with your vaccines. Tell your health care provider if: You often feel depressed. You have ever been abused or do not feel safe at home. Summary Adopting a healthy lifestyle and getting preventive care are important in promoting health and wellness. Follow your health  care provider's instructions about healthy diet, exercising, and getting tested or screened for diseases. Follow your health care provider's instructions on monitoring your cholesterol and blood pressure. This information is not intended to replace advice given to you by your health care provider. Make sure you discuss any questions you have with your health care provider. Document Revised: 02/19/2021 Document Reviewed: 02/19/2021 Elsevier Patient Education  2024 [employer].

## 2024-09-22 NOTE — Progress Notes (Signed)
 Pt says she does not want to be on the gabapentin  300 mg, she feels too sleepy. She agrees to take the gabapentin  100 mg at bedtime and in the am.   She at first said [name] took her off the Latuda , but that is not true. [name] did not say she should be off the Latuda . Ms Mallo agrees to stay on the Latuda .   She feels angry and does not really know why.   Pt seen at the [facility] unit. She had blood for CBC & CMET drawn, results pending.   She is anxious to get to her PCP appt.   She was concerned that she has gotten some drugs, but says she did not take them. She was told that she does not have drugs in her system unless she took them.    Shona Shad, PA-C 09/22/2024 2:52 PM

## 2024-09-22 NOTE — Progress Notes (Signed)
 Established Patient Office Visit  Subjective   Patient ID: Debbie Cox, female    DOB: June 30, 1961  Age: 63 y.o. MRN: [id number]  Chief Complaint  Patient presents with   Labs Only    Pt states she needs lab work done   Discussed the use of AI scribe software for clinical note transcription with the patient, who gave verbal consent to proceed.  History of Present Illness   Debbie Cox is a 63 year old female who presents for blood work and medication management.  She had labs drawn at an outside clinic at the end of October and recalls being told her kidney function, liver function, cholesterol, and blood sugar were good, but she does not have results available today. She is concerned the prior blood work may have been lost and is willing to repeat labs today despite possible insurance issues.  She started Latuda  and gabapentin  in October. After a medication change in November she developed significant mental side effects and that medication was stopped. She is now on 300 mg of gabapentin  at night but has had morning side effects and briefly stopped it before resuming. She is considering switching back to Seroquel  due to ongoing side effects and is here to review and adjust her medications.  She reports frequent urination and wonders if it is related to her high blood pressure.  She is currently living in a shelter and working toward becoming a international aid/development worker. She describes the shelter as restrictive and stressful, which affects her comfort and sense of safety. She is worried about missing appointments because she is unsure her name is on the list at the shelter.     Past Medical History:  Diagnosis Date   Anxiety    Bipolar disorder (HCC)    Depression    ETOH abuse    Hypertension    Joint pain    hands   Social History   Socioeconomic History   Marital status: Single    Spouse name: Not on file   Number of children: Not on file   Years of education: Not on file    Highest education level: Not on file  Occupational History   Not on file  Tobacco Use   Smoking status: Every Day    Current packs/day: 0.50    Average packs/day: 0.5 packs/day for 35.0 years (17.5 ttl pk-yrs)    Types: Cigarettes   Smokeless tobacco: Never  Substance and Sexual Activity   Alcohol use: Yes    Alcohol/week: 12.0 standard drinks of alcohol    Types: 12 Glasses of wine per week   Drug use: Not Currently    Types: Cocaine   Sexual activity: Not on file  Other Topics Concern   Not on file  Social History Narrative   Not on file   Social Drivers of Health   Financial Resource Strain: Low Risk  (07/15/2024)   Overall Financial Resource Strain (CARDIA)    Difficulty of Paying Living Expenses: Not hard at all  Food Insecurity: Food Insecurity Present (09/22/2024)   Hunger Vital Sign    Worried About Running Out of Food in the Last Year: Never true    Ran Out of Food in the Last Year: Sometimes true  Transportation Needs: Unmet Transportation Needs (09/22/2024)   PRAPARE - Transportation    Lack of Transportation (Medical): Yes    Lack of Transportation (Non-Medical): Yes  Physical Activity: Inactive (07/15/2024)   Exercise Vital Sign    Days of Exercise  per Week: 0 days    Minutes of Exercise per Session: Not on file  Stress: Stress Concern Present (07/15/2024)   [employer] of Occupational Health - Occupational Stress Questionnaire    Feeling of Stress: Very much  Social Connections: Moderately Isolated (07/15/2024)   Social Connection and Isolation Panel    Frequency of Communication with Friends and Family: More than three times a week    Frequency of Social Gatherings with Friends and Family: Never    Attends Religious Services: 1 to 4 times per year    Active Member of [employer] or Organizations: No    Attends Banker Meetings: Never    Marital Status: Widowed  Intimate Partner Violence: Not At Risk (09/22/2024)   Humiliation, Afraid, Rape,  and Kick questionnaire    Fear of Current or Ex-Partner: No    Emotionally Abused: No    Physically Abused: No    Sexually Abused: No   Family History  Adopted: Yes   Allergies  Allergen Reactions   Penicillins Anaphylaxis, Swelling and Other (See Comments)    Tongue swelled and couldn't breathe Has patient had a PCN reaction causing immediate rash, facial/tongue/throat swelling, SOB or lightheadedness with hypotension: Yes Has patient had a PCN reaction causing severe rash involving mucus membranes or skin necrosis: No Has patient had a PCN reaction that required hospitalization No Has patient had a PCN reaction occurring within the last 10 years: Yes If all of the above answers are NO, then may proceed with Cephalosporin use.    Niacin And Related Hives and Other (See Comments)    REACTION NOT AN ALLERGY > FLUSHING EXPECTED WITH NIACIN     Review of Systems  Constitutional: Negative.   HENT: Negative.    Eyes: Negative.   Respiratory:  Negative for shortness of breath.   Cardiovascular:  Negative for chest pain.  Gastrointestinal: Negative.   Genitourinary: Negative.   Musculoskeletal: Negative.   Skin: Negative.   Neurological: Negative.   Endo/Heme/Allergies: Negative.   Psychiatric/Behavioral: Negative.        Objective:     BP 115/67 (BP Location: Left Arm, Patient Position: Sitting, Cuff Size: Normal)   Pulse 64   LMP 02/11/2011   SpO2 100%  BP Readings from Last 3 Encounters:  09/22/24 115/67  09/21/24 115/72  09/15/24 109/74   Wt Readings from Last 3 Encounters:  09/14/24 127 lb 8 oz (57.8 kg)  08/31/24 127 lb 12.8 oz (58 kg)  08/17/24 126 lb 3.2 oz (57.2 kg)    Physical Exam Vitals and nursing note reviewed.  Constitutional:      Appearance: Normal appearance.  HENT:     Head: Normocephalic and atraumatic.     Right Ear: External ear normal.     Left Ear: External ear normal.     Nose: Nose normal.     Mouth/Throat:     Mouth: Mucous  membranes are moist.     Pharynx: Oropharynx is clear.  Eyes:     Extraocular Movements: Extraocular movements intact.     Conjunctiva/sclera: Conjunctivae normal.     Pupils: Pupils are equal, round, and reactive to light.  Cardiovascular:     Rate and Rhythm: Normal rate and regular rhythm.     Pulses: Normal pulses.     Heart sounds: Normal heart sounds.  Musculoskeletal:     Cervical back: Normal range of motion and neck supple.  Neurological:     Mental Status: She is alert.  Psychiatric:  Attention and Perception: Attention normal.        Speech: Speech is tangential.        Behavior: Behavior is hyperactive.        Thought Content: Thought content does not include homicidal or suicidal ideation.        Cognition and Memory: Memory normal.        Judgment: Judgment normal.         Assessment & Plan:   Problem List Items Addressed This Visit       Other   MDD (major depressive disorder), recurrent, severe, with psychosis (HCC) - Primary   Relevant Orders   CBC with Differential/Platelet   Comp. Metabolic Panel (12)   1. MDD (major depressive disorder), recurrent, severe, with psychosis (HCC) (Primary) Patient encouraged to come back to complete UA, she did have labs completed 08/12/24 , but there was a note from [facility] that asked for labs, unable to fasting labs, but her lipid panel and A1C were WNL 08/12/24.     - CBC with Differential/Platelet - Comp. Metabolic Panel (12)   I have reviewed the patient's medical history (PMH, PSH, Social History, Family History, Medications, and allergies) , and have been updated if relevant. I spent 30 minutes reviewing chart and  face to face time with patient.    Return if symptoms worsen or fail to improve.    Kirk RAMAN Mayers, PA-C

## 2024-09-23 ENCOUNTER — Emergency Department (HOSPITAL_COMMUNITY)
Admission: EM | Admit: 2024-09-23 | Discharge: 2024-09-23 | Disposition: A | Discharge status: Home / Self Care | Payer: MEDICAID | Attending: Emergency Medicine | Admitting: Emergency Medicine

## 2024-09-23 ENCOUNTER — Encounter (HOSPITAL_COMMUNITY): Payer: Self-pay

## 2024-09-23 ENCOUNTER — Other Ambulatory Visit: Payer: Self-pay

## 2024-09-23 DIAGNOSIS — F410 Panic disorder [episodic paroxysmal anxiety] without agoraphobia: Secondary | ICD-10-CM | POA: Diagnosis not present

## 2024-09-23 DIAGNOSIS — M545 Low back pain, unspecified: Secondary | ICD-10-CM | POA: Insufficient documentation

## 2024-09-23 DIAGNOSIS — Z7982 Long term (current) use of aspirin: Secondary | ICD-10-CM | POA: Diagnosis not present

## 2024-09-23 DIAGNOSIS — I10 Essential (primary) hypertension: Secondary | ICD-10-CM | POA: Insufficient documentation

## 2024-09-23 DIAGNOSIS — Z79899 Other long term (current) drug therapy: Secondary | ICD-10-CM | POA: Diagnosis not present

## 2024-09-23 DIAGNOSIS — R0602 Shortness of breath: Secondary | ICD-10-CM | POA: Diagnosis present

## 2024-09-23 DIAGNOSIS — G8929 Other chronic pain: Secondary | ICD-10-CM | POA: Insufficient documentation

## 2024-09-23 LAB — COMP. METABOLIC PANEL (12)
AST: 26 IU/L (ref 0–40)
Albumin: 4.4 g/dL (ref 3.9–4.9)
Alkaline Phosphatase: 97 IU/L (ref 49–135)
BUN/Creatinine Ratio: 14 (ref 12–28)
BUN: 18 mg/dL (ref 8–27)
Bilirubin Total: 0.3 mg/dL (ref 0.0–1.2)
Calcium: 9.5 mg/dL (ref 8.7–10.3)
Chloride: 105 mmol/L (ref 96–106)
Creatinine, Ser: 1.26 mg/dL — ABNORMAL HIGH (ref 0.57–1.00)
Globulin, Total: 2.9 g/dL (ref 1.5–4.5)
Glucose: 57 mg/dL — ABNORMAL LOW (ref 70–99)
Potassium: 4.5 mmol/L (ref 3.5–5.2)
Sodium: 141 mmol/L (ref 134–144)
Total Protein: 7.3 g/dL (ref 6.0–8.5)
eGFR: 48 mL/min/1.73 — ABNORMAL LOW (ref >59)

## 2024-09-23 LAB — CBC WITH DIFFERENTIAL/PLATELET
Basophils Absolute: 0 x10E3/uL (ref 0.0–0.2)
Basos: 1 %
EOS (ABSOLUTE): 0.1 x10E3/uL (ref 0.0–0.4)
Eos: 1 %
Hematocrit: 35.5 % (ref 34.0–46.6)
Hemoglobin: 11 g/dL — ABNORMAL LOW (ref 11.1–15.9)
Immature Grans (Abs): 0 x10E3/uL (ref 0.0–0.1)
Immature Granulocytes: 0 %
Lymphocytes Absolute: 1.7 x10E3/uL (ref 0.7–3.1)
Lymphs: 22 %
MCH: 26.5 pg — ABNORMAL LOW (ref 26.6–33.0)
MCHC: 31 g/dL — ABNORMAL LOW (ref 31.5–35.7)
MCV: 86 fL (ref 79–97)
Monocytes Absolute: 0.6 x10E3/uL (ref 0.1–0.9)
Monocytes: 8 %
Neutrophils Absolute: 5.2 x10E3/uL (ref 1.4–7.0)
Neutrophils: 68 %
Platelets: 200 x10E3/uL (ref 150–450)
RBC: 4.15 x10E6/uL (ref 3.77–5.28)
RDW: 16.2 % — ABNORMAL HIGH (ref 11.7–15.4)
WBC: 7.6 x10E3/uL (ref 3.4–10.8)

## 2024-09-23 MED ORDER — ACETAMINOPHEN 500 MG PO TABS
1000.0000 mg | ORAL_TABLET | Freq: Once | ORAL | Status: AC
  Administered 2024-09-23: 1000 mg via ORAL
  Filled 2024-09-23: qty 2

## 2024-09-23 NOTE — ED Triage Notes (Signed)
 Patient coming from Debbie Cox, complaint of SOB and Panic Attack also back pain. Patient has a history of HTN. Patient states she has had panic attacks in the past. Patient was fine once en route to ED. No treatment given.  120/60 82 20 99%

## 2024-09-23 NOTE — ED Provider Notes (Signed)
 [place] EMERGENCY DEPARTMENT AT [facility] Provider Note   CSN: [id number] Arrival date & time: 09/23/24  9681     Patient presents with: Panic Attack and Shortness of Breath   Debbie Cox is a 63 y.o. female.  Patient with past medical history significant for hypertension, bipolar disorder, anxiety, joint pain presents to the emergency department via EMS from the homeless shelter.  Patient initially complained of shortness of breath and a panic attack during transport.  This resolved on its own during transport and she has no more complaints of shortness of breath or anxiety right now.  She also complained of low back pain.  This is a chronic issue for the patient.  She states she has chronic low back pain and chronic neck pain.  She is also complaining of a flare of her neck pain.  She states she takes gabapentin  and Flexeril .  She has no falls and no new complaints.  She denies any weakness.    Shortness of Breath      Prior to Admission medications  Medication Sig Start Date End Date Taking? Authorizing Provider  amLODipine  (NORVASC ) 10 MG tablet Take 1 tablet (10 mg total) by mouth daily. 09/07/24 11/06/24  Barrett, Rhonda G, PA-C  aspirin EC 81 MG tablet Take 1 tablet by mouth daily. Patient not taking: Reported on 09/22/2024 05/22/21   [provider]  benzonatate  (TESSALON ) 100 MG capsule Take 1 to 2 capsules (200 mg total) by mouth 3 (three) times daily as needed 07/06/24   Mayers, Cari S, PA-C  cetirizine  (ZYRTEC ) 10 MG tablet Take 1 tablet (10 mg total) by mouth daily as needed for allergies. 07/06/24   Mayers, Cari S, PA-C  cyclobenzaprine  (FLEXERIL ) 10 MG tablet Take 1 tablet (10 mg total) by mouth 2 (two) times daily as needed for muscle spasms. Patient not taking: Reported on 09/22/2024 07/25/24   Tegeler, Lonni PARAS, MD  gabapentin  (NEURONTIN ) 300 MG capsule Take 1 capsule (300 mg total) by mouth 3 (three) times daily. 09/14/24   Harl Zane BRAVO, NP  ibuprofen  (ADVIL ) 600 MG tablet Take 1 tablet (600 mg total) by mouth every 6 (six) hours as needed. 02/13/24   Odell Balls, PA-C  lidocaine  (LIDODERM ) 5 % Place 1 patch onto the skin daily. Remove & Discard patch within 12 hours or as directed by MD 02/17/24   Victor Lynwood DASEN, PA-C  lidocaine  (LIDODERM ) 5 % Place 1 patch onto the skin daily. Remove & Discard patch within 12 hours or as directed by MD 07/25/24   Tegeler, Lonni PARAS, MD  lurasidone  (LATUDA ) 40 MG TABS tablet Take 1 tablet (40 mg total) by mouth daily with breakfast. 09/02/24   Parsons, Brittney E, NP  meloxicam (MOBIC) 7.5 MG tablet Take 1 tablet by mouth daily. 05/21/23   [provider]  methocarbamol  (ROBAXIN ) 750 MG tablet Take 1 tablet (750 mg total) by mouth 3 (three) times daily. 02/13/24   Odell Balls, PA-C  valsartan  (DIOVAN ) 40 MG tablet Take 1 tablet (40 mg total) by mouth daily. 09/07/24   Barrett, Shona MATSU, PA-C    Allergies: Penicillins and Niacin and related    Review of Systems  Respiratory:  Positive for shortness of breath.     Updated Vital Signs BP 120/60 (BP Location: Right Arm)   Pulse 82   Resp 20   LMP 02/11/2011   SpO2 99%   Physical Exam Vitals and nursing note reviewed.  Constitutional:  General: She is not in acute distress.    Appearance: She is well-developed. She is not ill-appearing, toxic-appearing or diaphoretic.  HENT:     Head: Normocephalic and atraumatic.     Nose: No congestion or rhinorrhea.     Mouth/Throat:     Mouth: Mucous membranes are moist.  Eyes:     Extraocular Movements: Extraocular movements intact.     Conjunctiva/sclera: Conjunctivae normal.     Pupils: Pupils are equal, round, and reactive to light.  Cardiovascular:     Rate and Rhythm: Normal rate and regular rhythm.     Heart sounds: No murmur heard. Pulmonary:     Effort: Pulmonary effort is normal. No respiratory distress.     Breath sounds: Normal breath sounds. No wheezing,  rhonchi or rales.  Chest:     Chest wall: No tenderness.  Abdominal:     General: Abdomen is flat.     Palpations: Abdomen is soft.     Tenderness: There is no abdominal tenderness. There is no guarding or rebound.  Musculoskeletal:        General: Tenderness present. No swelling.     Cervical back: Neck supple. Tenderness present.     Right lower leg: No edema.     Left lower leg: No edema.  Skin:    General: Skin is warm and dry.     Capillary Refill: Capillary refill takes less than 2 seconds.     Findings: No bruising, erythema or rash.  Neurological:     General: No focal deficit present.     Mental Status: She is alert.     Sensory: No sensory deficit.     Motor: No weakness.  Psychiatric:        Mood and Affect: Mood normal.     (all labs ordered are listed, but only abnormal results are displayed) Labs Reviewed - No data to display  EKG: None  Radiology: No results found.   Procedures   Medications Ordered in the ED  acetaminophen  (TYLENOL ) tablet 1,000 mg (1,000 mg Oral Given 09/23/24 0453)                                    Medical Decision Making Risk OTC drugs.   This patient presents to the ED for concern of neck and back pain, this involves an extensive number of treatment options, and is a complaint that carries with it a high risk of complications and morbidity.     Co morbidities / Chronic conditions that complicate the patient evaluation  As noted in HPI   Additional history obtained:  Additional history obtained from EMR External records from outside source obtained and reviewed including notes showing patient has been referred to orthopedics  Problem List / ED Course / Critical interventions / Medication management   I ordered medication including Tylenol  Reevaluation of the patient after these medicines showed that the patient improved  Social Determinants of Health:  Patient is homeless   Test / Admission -  Considered:  No indication for further emergent workup or admission at this time. Panic/dyspnea subsided before my assessment.  Patient with flare of her chronic underlying neck and back pain.  No trauma reported.  No red flag symptoms such as saddle anesthesia, urinary incontinence or retention, fecal incontinence.  Patient treated with Tylenol .  She is currently taking Flexeril  and gabapentin  and I do not see much to add  to her pain control regimen from the emergent standpoint.  Patient should follow-up with primary care or orthopedics for further management.      Final diagnoses:  Panic attack  Chronic bilateral low back pain without sciatica    ED Discharge Orders     None          Logan Ubaldo KATHEE DEVONNA 09/23/24 9391    Raford Lenis, MD 09/23/24 [phone]

## 2024-09-23 NOTE — Congregational Nurse Program (Signed)
°  Dept: [phone]   Congregational Nurse Program Note  Date of Encounter: 09/21/2024  Clinic visit to check blood pressure and to assist resident to getting to [facility] to have blood drawn for laboratory studies. Past Medical History: Past Medical History:  Diagnosis Date   Anxiety    Bipolar disorder (HCC)    Depression    ETOH abuse    Hypertension    Joint pain    hands    Encounter Details:  Community Questionnaire - 09/21/24 1300       Questionnaire   Ask client: Do you give verbal consent for me to treat you today? Yes    Student Assistance N/A    Location Patient Served  [facility]    Encounter Setting CN site    Population Status Unhoused    Insurance Medicaid    Insurance/Financial Assistance Referral N/A    Medication N/A    Medical Provider Yes    Screening Referrals Made N/A    Medical Referrals Made N/A    Medical Appointment Completed N/A    CNP Interventions Advocate/Support;Case Management;Counsel;Navigate Healthcare System    Screenings CN Performed N/A    ED Visit Averted N/A    Life-Saving Intervention Made N/A

## 2024-09-23 NOTE — Discharge Instructions (Signed)
 Please follow-up with your primary care provider for further evaluation of your neck and back pain.  Return to the emergency department if develop life-threatening symptoms.

## 2024-09-27 ENCOUNTER — Ambulatory Visit: Payer: Self-pay | Admitting: Physician Assistant

## 2024-09-27 DIAGNOSIS — R944 Abnormal results of kidney function studies: Secondary | ICD-10-CM

## 2024-09-28 ENCOUNTER — Ambulatory Visit (HOSPITAL_COMMUNITY): Payer: Self-pay | Admitting: Clinical

## 2024-09-28 NOTE — Progress Notes (Signed)
Attempted to contact pt, no answer

## 2024-09-29 ENCOUNTER — Other Ambulatory Visit: Payer: Self-pay | Admitting: Physician Assistant

## 2024-09-29 ENCOUNTER — Encounter: Payer: Self-pay | Admitting: *Deleted

## 2024-09-29 ENCOUNTER — Ambulatory Visit: Payer: MEDICAID

## 2024-09-29 DIAGNOSIS — R944 Abnormal results of kidney function studies: Secondary | ICD-10-CM

## 2024-09-29 NOTE — Progress Notes (Signed)
 [facility] to [facility] for repeat lab draw

## 2024-09-29 NOTE — Progress Notes (Signed)
 Labs collected.

## 2024-09-29 NOTE — Congregational Nurse Program (Signed)
°  Dept: [phone]   Congregational Nurse Program Note  Date of Encounter: 09/29/2024  Past Medical History: Past Medical History:  Diagnosis Date   Anxiety    Bipolar disorder (HCC)    Depression    ETOH abuse    Hypertension    Joint pain    hands    Encounter Details:  Community Questionnaire - 09/29/24 1423       Questionnaire   Ask client: Do you give verbal consent for me to treat you today? Yes    Student Assistance N/A    Location Patient Served  [facility]    Encounter Setting CN site    Population Status Unhoused    Insurance Medicaid    Insurance/Financial Assistance Referral N/A    Medication Have Medication Insecurities;Provided Medication Assistance    Medical Provider Yes    Screening Referrals Made N/A    Medical Referrals Made [facility]    Medical Appointment Completed N/A    CNP Interventions Advocate/Support;Navigate Healthcare System;Case Management    Screenings CN Performed N/A    ED Visit Averted N/A    Life-Saving Intervention Made N/A         Client came to nurse's office requesting pill box be filled. Client reports she will not take gabapentin  300mg  as ordered saying it is too much for her makes her sedated. Referred client to [facility] clinic for repeat labs as requested by PA. Spoke with [facility] PA about client's medication. Ok to use gabapentin  100mg  instead of 300mg . Client had another persons medication with her a cough syrup. Explained she should not be taking another persons medication. She picked medication and left building. Filled up pill box as requested and put in storage area. Yahir Tavano W RN CN

## 2024-09-30 ENCOUNTER — Ambulatory Visit: Payer: Self-pay | Admitting: Physician Assistant

## 2024-09-30 LAB — BASIC METABOLIC PANEL WITH GFR
BUN/Creatinine Ratio: 16 (ref 12–28)
BUN: 17 mg/dL (ref 8–27)
CO2: 18 mmol/L — ABNORMAL LOW (ref 20–29)
Calcium: 9.2 mg/dL (ref 8.7–10.3)
Chloride: 106 mmol/L (ref 96–106)
Creatinine, Ser: 1.06 mg/dL — ABNORMAL HIGH (ref 0.57–1.00)
Glucose: 70 mg/dL (ref 70–99)
Potassium: 4.3 mmol/L (ref 3.5–5.2)
Sodium: 142 mmol/L (ref 134–144)
eGFR: 59 mL/min/1.73 — ABNORMAL LOW (ref >59)

## 2024-09-30 LAB — SPECIMEN STATUS REPORT

## 2024-10-04 NOTE — Progress Notes (Signed)
 Phone number not in service

## 2024-10-05 NOTE — Progress Notes (Signed)
 Phone not in service.

## 2024-10-11 ENCOUNTER — Other Ambulatory Visit: Payer: Self-pay | Admitting: Critical Care Medicine

## 2024-10-11 ENCOUNTER — Encounter: Payer: Self-pay | Admitting: Critical Care Medicine

## 2024-10-11 ENCOUNTER — Other Ambulatory Visit: Payer: Self-pay

## 2024-10-11 ENCOUNTER — Encounter: Payer: Self-pay | Admitting: *Deleted

## 2024-10-11 DIAGNOSIS — F411 Generalized anxiety disorder: Secondary | ICD-10-CM

## 2024-10-11 DIAGNOSIS — R051 Acute cough: Secondary | ICD-10-CM

## 2024-10-11 DIAGNOSIS — F3162 Bipolar disorder, current episode mixed, moderate: Secondary | ICD-10-CM

## 2024-10-11 MED ORDER — GABAPENTIN 300 MG PO CAPS: 300.0000 mg | ORAL_CAPSULE | Freq: Two times a day (BID) | ORAL | Status: DC

## 2024-10-11 MED ORDER — AZITHROMYCIN 250 MG PO TABS
ORAL_TABLET | ORAL | 0 refills | Status: AC
  Filled 2024-10-11: qty 6, 5d supply, fill #0

## 2024-10-11 MED ORDER — BENZONATATE 100 MG PO CAPS
200.0000 mg | ORAL_CAPSULE | Freq: Three times a day (TID) | ORAL | 0 refills | Status: DC
  Filled 2024-10-11: qty 20, 4d supply, fill #0

## 2024-10-11 NOTE — Congregational Nurse Program (Signed)
" °  Dept: [phone]   Congregational Nurse Program Note  Date of Encounter: 10/11/2024  Past Medical History: Past Medical History:  Diagnosis Date   Anxiety    Bipolar disorder (HCC)    Depression    ETOH abuse    Hypertension    Joint pain    hands    Encounter Details:  Community Questionnaire - 10/11/24 1538       Questionnaire   Ask client: Do you give verbal consent for me to treat you today? Yes    Student Assistance N/A    Location Patient Served  [facility]    Encounter Setting CN site    Population Status Unhoused    Insurance Medicaid    Insurance/Financial Assistance Referral N/A    Medication Have Medication Insecurities;Provided Medication Assistance;Patient Medications Reviewed    Medical Provider Yes    Screening Referrals Made N/A    Medical Referrals Made N/A    Medical Appointment Completed N/A    CNP Interventions Advocate/Support;Case Management    Screenings CN Performed N/A    ED Visit Averted N/A    Life-Saving Intervention Made N/A         Picked up three medications tessalon  perles, antibiotic and amlodipine  per MD request and left at [facility] front desk with client's name and bed number.Filled weekly pill box per client request while she was in [facility] clinic. Ryleigh Esqueda W RN CN    "

## 2024-10-11 NOTE — Progress Notes (Signed)
Abx for bronchitis

## 2024-10-12 NOTE — Congregational Nurse Program (Signed)
" °  Dept: [phone]   Congregational Nurse Program Note  Date of Encounter: 10/12/2024  Came to clinic with questions regarding order for Zithromax , medication started on 10/11/2024. Ordered for 500 mg first day then 4 days of 250 mg.  On evening of 12/29 resident took 250 mg instead of 500 mg as prescribed.  Took the other 250 mg in clinic, filled pillbox for resident for remainder of the prescribed medicine. Past Medical History: Past Medical History:  Diagnosis Date   Anxiety    Bipolar disorder (HCC)    Depression    ETOH abuse    Hypertension    Joint pain    hands    Encounter Details:  Community Questionnaire - 10/12/24 1100       Questionnaire   Ask client: Do you give verbal consent for me to treat you today? Yes    Student Assistance N/A    Location Patient Served  [facility]    Encounter Setting CN site    Population Status Unhoused    Insurance Medicaid    Insurance/Financial Assistance Referral N/A    Medication Have Medication Insecurities;Provided Medication Assistance;Patient Medications Reviewed    Medical Provider Yes    Screening Referrals Made N/A    Medical Referrals Made N/A    Medical Appointment Completed [facility] PCP/Clinic    CNP Interventions Advocate/Support;Case Management    Screenings CN Performed N/A    ED Visit Averted N/A    Life-Saving Intervention Made N/A            "

## 2024-10-12 NOTE — Progress Notes (Signed)
 This patient seen today in the [facility] clinic with a productive cough this has been going on for 1 week she had no fever or bodyaches.  The mucus is green and gray.  She is a cigarette smoker.  Exam showed scattered rhonchi.  Plan will be for the patient receive azithromycin  5-day course and benzonatate  and we will follow her up expectantly.  We clarified with the patient also the dosing of her gabapentin  she says 3 times a day 300 mg makes her too sleepy this was adjusted by psychiatry we will reduce the dose to 300 mg twice daily and she is to stay on the Latuda  at current dose

## 2024-10-18 ENCOUNTER — Encounter: Payer: Self-pay | Admitting: *Deleted

## 2024-10-18 NOTE — Congregational Nurse Program (Signed)
" °  Dept: [phone]   Congregational Nurse Program Note  Date of Encounter: 10/18/2024  Past Medical History: Past Medical History:  Diagnosis Date   Anxiety    Bipolar disorder (HCC)    Depression    ETOH abuse    Hypertension    Joint pain    hands    Encounter Details:  Community Questionnaire - 10/18/24 1234       Questionnaire   Ask client: Do you give verbal consent for me to treat you today? Yes    Student Assistance N/A    Location Patient Served  [facility]    Encounter Setting CN site    Population Status Unhoused    Insurance Medicaid    Insurance/Financial Assistance Referral N/A    Medication Have Medication Insecurities;Patient Medications Reviewed;CN Made Medication Intervention (pickup etc)    Medical Provider Yes    Medical Referrals Made NA    Medical Appointment Completed N/A    Screenings CN Performed (remember to also record results) NA    CNP Interventions Advocate/Support;Case Management    ED Visit Averted N/A      Questionnaire   Life-Saving Intervention Made N/A         Client was seen outside shelter and she asked writer to fill up her pill box. Filled pill box as requested. Called [facility] pharmacy for refill of valsartan  as client is almost out. Mazal Ebey W RN CN   "

## 2024-10-21 ENCOUNTER — Encounter: Payer: Self-pay | Admitting: *Deleted

## 2024-10-21 NOTE — Congregational Nurse Program (Signed)
" °  Dept: [phone]   Congregational Nurse Program Note  Date of Encounter: 10/21/2024  Past Medical History: Past Medical History:  Diagnosis Date   Anxiety    Bipolar disorder (HCC)    Depression    ETOH abuse    Hypertension    Joint pain    hands    Encounter Details:  Community Questionnaire - 10/21/24 1218       Questionnaire   Ask client: Do you give verbal consent for me to treat you today? N/A    Student Assistance N/A    Location Patient Served  [facility]    Encounter Setting CN site    Population Status Unhoused    Insurance Medicaid    Insurance/Financial Assistance Referral N/A    Medication Have Medication Insecurities;CN Made Medication Intervention (pickup etc)    Medical Provider Yes    Medical Referrals Made NA    Medical Appointment Completed N/A    Screenings CN Performed (remember to also record results) NA    CNP Interventions Advocate/Support    ED Visit Averted N/A      Questionnaire   Life-Saving Intervention Made N/A         Picked up medication that was called to pharmacy for refill last week and brought back to [facility]. Per staff client has existed [facility] shelter. Client's belonging are in lobby for client to pick up including her medication bag. Placed medication that was picked up in her bag.  Genowefa Morga W RN CN  "

## 2024-10-28 ENCOUNTER — Ambulatory Visit (INDEPENDENT_AMBULATORY_CARE_PROVIDER_SITE_OTHER): Payer: MEDICAID | Admitting: Psychiatry

## 2024-10-28 ENCOUNTER — Encounter (HOSPITAL_COMMUNITY): Payer: Self-pay | Admitting: Psychiatry

## 2024-10-28 VITALS — BP 146/88 | HR 68 | Temp 97.3°F | Wt 127.8 lb

## 2024-10-28 DIAGNOSIS — Z720 Tobacco use: Secondary | ICD-10-CM | POA: Diagnosis not present

## 2024-10-28 DIAGNOSIS — F3162 Bipolar disorder, current episode mixed, moderate: Secondary | ICD-10-CM

## 2024-10-28 DIAGNOSIS — F411 Generalized anxiety disorder: Secondary | ICD-10-CM | POA: Diagnosis not present

## 2024-10-28 MED ORDER — NICOTINE POLACRILEX 4 MG MT GUM: 4.0000 mg | CHEWING_GUM | OROMUCOSAL | 3 refills | Status: AC | PRN

## 2024-10-28 MED ORDER — QUETIAPINE FUMARATE 50 MG PO TABS: 50.0000 mg | ORAL_TABLET | Freq: Every day | ORAL | 3 refills | Status: AC

## 2024-10-28 MED ORDER — GABAPENTIN 300 MG PO CAPS: 300.0000 mg | ORAL_CAPSULE | Freq: Two times a day (BID) | ORAL | 3 refills | Status: AC
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
## 2024-11-03 ENCOUNTER — Ambulatory Visit: Payer: MEDICAID | Admitting: Family Medicine

## 2024-11-03 ENCOUNTER — Encounter: Payer: Self-pay | Admitting: Family Medicine

## 2024-11-03 VITALS — BP 129/88 | HR 80 | Ht 66.0 in | Wt 127.0 lb

## 2024-11-03 DIAGNOSIS — Z7689 Persons encountering health services in other specified circumstances: Secondary | ICD-10-CM

## 2024-11-03 DIAGNOSIS — R051 Acute cough: Secondary | ICD-10-CM

## 2024-11-03 DIAGNOSIS — Z139 Encounter for screening, unspecified: Secondary | ICD-10-CM

## 2024-11-03 DIAGNOSIS — Z23 Encounter for immunization: Secondary | ICD-10-CM | POA: Diagnosis not present

## 2024-11-03 DIAGNOSIS — Z720 Tobacco use: Secondary | ICD-10-CM | POA: Diagnosis not present

## 2024-11-03 DIAGNOSIS — R052 Subacute cough: Secondary | ICD-10-CM | POA: Diagnosis not present

## 2024-11-03 DIAGNOSIS — M21619 Bunion of unspecified foot: Secondary | ICD-10-CM | POA: Diagnosis not present

## 2024-11-03 DIAGNOSIS — I10 Essential (primary) hypertension: Secondary | ICD-10-CM | POA: Diagnosis not present

## 2024-11-03 DIAGNOSIS — Z1231 Encounter for screening mammogram for malignant neoplasm of breast: Secondary | ICD-10-CM

## 2024-11-03 DIAGNOSIS — B351 Tinea unguium: Secondary | ICD-10-CM

## 2024-11-03 MED ORDER — BENZONATATE 100 MG PO CAPS: 200.0000 mg | ORAL_CAPSULE | Freq: Three times a day (TID) | ORAL | 0 refills | Status: AC

## 2024-11-05 ENCOUNTER — Encounter: Payer: Self-pay | Admitting: Family Medicine

## 2024-11-05 NOTE — Progress Notes (Addendum)
 "  New Patient Office Visit  Subjective    Patient ID: Debbie Cox, female    DOB: 1961-09-04  Age: 64 y.o. MRN: [id number]  CC:  Chief Complaint  Patient presents with   Establish Care    Pt here to establish care per recommendation from Dr. Brien. Pt regularly sees Dr. Brien in the community (mobile unit)    HPI Debbie Cox presents to establish care. She reports toenail fungus and bunions as well. She was recently treated for bronchitis and is much improved. However, she has had continued to cough intermittently.    Outpatient Encounter Medications as of 11/03/2024  Medication Sig   amLODipine  (NORVASC ) 10 MG tablet Take 1 tablet (10 mg total) by mouth daily.   gabapentin  (NEURONTIN ) 300 MG capsule Take 1 capsule (300 mg total) by mouth 2 (two) times daily. Take half a tablet twice daily (150 mg) twice daily   valsartan  (DIOVAN ) 40 MG tablet Take 1 tablet (40 mg total) by mouth daily.   aspirin EC 81 MG tablet Take 1 tablet by mouth daily. (Patient not taking: Reported on 09/22/2024)   benzonatate  (TESSALON ) 100 MG capsule Take 1 to 2 capsules (200 mg total) by mouth 3 (three) times daily as needed   cetirizine  (ZYRTEC ) 10 MG tablet Take 1 tablet (10 mg total) by mouth daily as needed for allergies. (Patient not taking: Reported on 11/03/2024)   cyclobenzaprine  (FLEXERIL ) 10 MG tablet Take 1 tablet (10 mg total) by mouth 2 (two) times daily as needed for muscle spasms. (Patient not taking: Reported on 09/22/2024)   ibuprofen  (ADVIL ) 600 MG tablet Take 1 tablet (600 mg total) by mouth every 6 (six) hours as needed.   lidocaine  (LIDODERM ) 5 % Place 1 patch onto the skin daily. Remove & Discard patch within 12 hours or as directed by MD   lidocaine  (LIDODERM ) 5 % Place 1 patch onto the skin daily. Remove & Discard patch within 12 hours or as directed by MD   meloxicam (MOBIC) 7.5 MG tablet Take 1 tablet by mouth daily.   methocarbamol  (ROBAXIN ) 750 MG tablet Take 1 tablet (750 mg  total) by mouth 3 (three) times daily. (Patient not taking: Reported on 11/03/2024)   nicotine  polacrilex (NICORETTE ) 4 MG gum Take 1 each (4 mg total) by mouth as needed for smoking cessation.   QUEtiapine  (SEROQUEL ) 50 MG tablet Take 1 tablet (50 mg total) by mouth at bedtime. (Patient not taking: Reported on 11/03/2024)   [DISCONTINUED] benzonatate  (TESSALON ) 100 MG capsule Take 1 to 2 capsules (200 mg total) by mouth 3 (three) times daily as needed   No facility-administered encounter medications on file as of 11/03/2024.    Past Medical History:  Diagnosis Date   Anxiety    Bipolar disorder (HCC)    Depression    ETOH abuse    Hypertension    Joint pain    hands    Past Surgical History:  Procedure Laterality Date   CESAREAN SECTION     MULTIPLE EXTRACTIONS WITH ALVEOLOPLASTY Bilateral 12/20/2016   Procedure: BILATERAL TORI;  Surgeon: Glendia Primrose, DDS;  Location: [facility] OR;  Service: Oral Surgery;  Laterality: Bilateral;    Family History  Adopted: Yes    Social History   Socioeconomic History   Marital status: Single    Spouse name: Not on file   Number of children: Not on file   Years of education: Not on file   Highest education level: Not on file  Occupational  History   Not on file  Tobacco Use   Smoking status: Every Day    Current packs/day: 0.50    Average packs/day: 0.5 packs/day for 35.0 years (17.5 ttl pk-yrs)    Types: Cigarettes   Smokeless tobacco: Never  Substance and Sexual Activity   Alcohol use: Yes    Alcohol/week: 12.0 standard drinks of alcohol    Types: 12 Glasses of wine per week   Drug use: Not Currently    Types: Cocaine   Sexual activity: Not on file  Other Topics Concern   Not on file  Social History Narrative   Not on file   Social Drivers of Health   Tobacco Use: High Risk (11/03/2024)   Patient History    Smoking Tobacco Use: Every Day    Smokeless Tobacco Use: Never    Passive Exposure: Not on file  Financial Resource Strain:  Low Risk (07/15/2024)   Overall Financial Resource Strain (CARDIA)    Difficulty of Paying Living Expenses: Not hard at all  Food Insecurity: Food Insecurity Present (11/03/2024)   Epic    Worried About Programme Researcher, Broadcasting/film/video in the Last Year: Sometimes true    Ran Out of Food in the Last Year: Sometimes true  Transportation Needs: Unmet Transportation Needs (11/03/2024)   Epic    Lack of Transportation (Medical): Yes    Lack of Transportation (Non-Medical): Yes  Physical Activity: Inactive (07/15/2024)   Exercise Vital Sign    Days of Exercise per Week: 0 days    Minutes of Exercise per Session: Not on file  Stress: Stress Concern Present (07/15/2024)   [employer] of Occupational Health - Occupational Stress Questionnaire    Feeling of Stress: Very much  Social Connections: Moderately Isolated (07/15/2024)   Social Connection and Isolation Panel    Frequency of Communication with Friends and Family: More than three times a week    Frequency of Social Gatherings with Friends and Family: Never    Attends Religious Services: 1 to 4 times per year    Active Member of [employer] or Organizations: No    Attends Banker Meetings: Never    Marital Status: Widowed  Intimate Partner Violence: Not At Risk (11/03/2024)   Epic    Fear of Current or Ex-Partner: No    Emotionally Abused: No    Physically Abused: No    Sexually Abused: No  Depression (PHQ2-9): Medium Risk (11/03/2024)   Depression (PHQ2-9)    PHQ-2 Score: 9  Alcohol Screen: Medium Risk (07/15/2024)   Alcohol Screen    Last Alcohol Screening Score (AUDIT): 13  Housing: High Risk (11/03/2024)   Epic    Unable to Pay for Housing in the Last Year: Yes    Number of Times Moved in the Last Year: 2    Homeless in the Last Year: Yes  Utilities: Not At Risk (11/03/2024)   Epic    Threatened with loss of utilities: No  Health Literacy: Adequate Health Literacy (07/15/2024)   B1300 Health Literacy    Frequency of need for help  with medical instructions: Never    Review of Systems  All other systems reviewed and are negative.       Objective   BP 129/88   Pulse 80   Ht 5' 6 (1.676 m)   Wt 127 lb (57.6 kg)   LMP 02/11/2011   SpO2 98%   BMI 20.50 kg/m   Physical Exam Vitals and nursing note reviewed.  Constitutional:  General: She is not in acute distress. Cardiovascular:     Rate and Rhythm: Normal rate and regular rhythm.  Pulmonary:     Effort: Pulmonary effort is normal.     Breath sounds: Normal breath sounds.  Abdominal:     Palpations: Abdomen is soft.     Tenderness: There is no abdominal tenderness.  Neurological:     General: No focal deficit present.     Mental Status: She is alert and oriented to person, place, and time.         Assessment & Plan:   2. Essential hypertension Appears stable. Continue   3. Subacute cough Tessalon  perles for cough. Discussed normalcy of coughing after symptoms have resolved.  - benzonatate  (TESSALON ) 100 MG capsule; Take 1 to 2 capsules (200 mg total) by mouth 3 (three) times daily as needed  Dispense: 20 capsule; Refill: 0  4. Toenail fungus  - Ambulatory referral to Podiatry  5. Bunion  - Ambulatory referral to Podiatry  6. Need for shingles vaccine  - Zoster, Recombinant (Shingrix )  7. Tobacco abuse Discussed reduction/cessation  8. Need for vaccination against Streptococcus pneumoniae  - Pneumococcal conjugate vaccine 20-valent (PCV20)  9. Encounter for screening mammogram for malignant neoplasm of breast  - MM 3D SCREENING MAMMOGRAM BILATERAL BREAST; Future  10. Encounter for screening involving social determinants of health (SDoH)  - AMB Referral VBCI Care Management  11. Encounter to establish care     No follow-ups on file.   Tanda Raguel SQUIBB, MD  "

## 2024-11-09 ENCOUNTER — Telehealth: Payer: Self-pay | Admitting: *Deleted

## 2024-11-09 NOTE — Progress Notes (Signed)
 Complex Care Management Note Care Guide Note  11/09/2024 Name: Ricki Vanhandel MRN: [id number] DOB: [birth year]   Complex Care Management Outreach Attempts: An unsuccessful telephone outreach was attempted today to offer the patient information about available complex care management services.  Follow Up Plan:  Additional outreach attempts will be made to offer the patient complex care management information and services.   Encounter Outcome:  No Answer  Harlene Satterfield  [facility]  [facility], [facility] Guide  Direct Dial: [phone]  Fax [phone]

## 2024-11-09 NOTE — Progress Notes (Addendum)
 Complex Care Management Note  Care Guide Note 11/09/2024 Name: Debbie Cox MRN: [id number] DOB: 02-Jan-1961  Debbie Cox is a 64 y.o. year old female who sees Debbie Bleacher, MD for primary care. I reached out to Debbie Cox by phone today to offer complex care management services.  Debbie Cox was given information about Complex Care Management services today including:   The Complex Care Management services include support from the care team which includes your Nurse Care Manager, Clinical Social Worker, or Pharmacist.  The Complex Care Management team is here to help remove barriers to the health concerns and goals most important to you. Complex Care Management services are voluntary, and the patient may decline or stop services at any time by request to their care team member.   Patient stated I wished she could go to sleep and never wake up.   Patient was advised to contact 911 immediately if she feels unsafe or is in any danger. Patient was asked to confirm her current location for safety assessment but refused to confirm her location.Patient refused for me to call 911. The patient was provided the following crisis and support resources:  911 [facility] - [email or web address]://[email or web address].org/ Member Services: 438-812-8005 Behavioral Health Crisis Line: 763-009-2928  Patient verbalized understanding but declined further help.   Complex Care Management Consent Status: Patient agreed to services and verbal consent obtained.   Follow up plan:  Telephone appointment with complex care management team member scheduled for:  11/15/24  Encounter Outcome:  Patient Scheduled   Harlene Satterfield  [facility]  [facility], [facility] Guide  Direct Dial: [phone]  Fax [phone]

## 2024-11-15 ENCOUNTER — Telehealth: Payer: MEDICAID

## 2024-11-15 NOTE — Patient Instructions (Signed)
 Debbie Cox - I am sorry I was unable to reach you today for our scheduled appointment. I work with Tanda Bleacher, MD and am calling to support your healthcare needs. Please contact me at [phone] at your earliest convenience. I look forward to speaking with you soon.   Thank you,  Tillman Gardener, BSW [place]  [facility], [facility] Social Worker Direct Dial: [phone]  Fax: [phone] Website: [email or web address]

## 2024-11-17 ENCOUNTER — Telehealth: Payer: Self-pay | Admitting: *Deleted

## 2024-11-17 ENCOUNTER — Ambulatory Visit: Payer: MEDICAID | Admitting: Podiatry

## 2024-11-17 NOTE — Progress Notes (Unsigned)
 Complex Care Management Care Guide Note  11/17/2024 Name: Debbie Cox MRN: [id number] DOB: [birth year]  Debbie Cox is a 64 y.o. year old female who is a primary care patient of Tanda Bleacher, MD and is actively engaged with the care management team. I reached out to [name] by phone today to assist with re-scheduling  with the BSW.  Follow up plan: Unsuccessful telephone outreach attempt made. A HIPAA compliant phone message was left for the patient providing contact information and requesting a return call.  Thedford Franks, CMA, AAMA [place]  [facility], [facility] Guide, Lead Direct Dial: [phone]  Fax: [phone]

## 2024-11-18 NOTE — Progress Notes (Signed)
 Complex Care Management Care Guide Note  11/18/2024 Name: [name] MRN: [id number] DOB: Feb 05, 1961  Debbie Cox is a 64 y.o. year old female who is a primary care patient of Tanda Bleacher, MD and is actively engaged with the care management team. I reached out to Debbie Cox by phone today to assist with re-scheduling  with the BSW.  Follow up plan: Telephone appointment with complex care management team member scheduled for:  11/24/2024  Thedford Franks, CMA, [facility]  [facility], [facility] Guide, Lead Direct Dial: [phone]  Fax: [phone]

## 2024-11-24 ENCOUNTER — Telehealth: Payer: Self-pay

## 2024-12-01 ENCOUNTER — Ambulatory Visit: Admitting: Podiatry

## 2024-12-07 ENCOUNTER — Encounter (HOSPITAL_COMMUNITY): Payer: MEDICAID | Admitting: Psychiatry

## 2024-12-16 ENCOUNTER — Ambulatory Visit: Payer: Self-pay | Admitting: Family Medicine
